# Patient Record
Sex: Female | Born: 1955 | Race: White | Hispanic: No | Marital: Married | Smoking: Former smoker
Health system: Southern US, Community
[De-identification: ages and names within clinical notes are randomized; demographics above are authoritative.]

## PROBLEM LIST (undated history)

## (undated) DIAGNOSIS — E785 Hyperlipidemia, unspecified: Secondary | ICD-10-CM

## (undated) DIAGNOSIS — F329 Major depressive disorder, single episode, unspecified: Secondary | ICD-10-CM

## (undated) DIAGNOSIS — F32A Depression, unspecified: Secondary | ICD-10-CM

## (undated) DIAGNOSIS — E079 Disorder of thyroid, unspecified: Secondary | ICD-10-CM

## (undated) HISTORY — DX: Depression, unspecified: F32.A

## (undated) HISTORY — DX: Disorder of thyroid, unspecified: E07.9

## (undated) HISTORY — DX: Major depressive disorder, single episode, unspecified: F32.9

## (undated) HISTORY — DX: Hyperlipidemia, unspecified: E78.5

---

## 2001-03-25 ENCOUNTER — Other Ambulatory Visit: Admission: RE | Admit: 2001-03-25 | Discharge: 2001-03-25 | Payer: Self-pay | Admitting: Internal Medicine

## 2006-08-18 ENCOUNTER — Ambulatory Visit: Payer: Self-pay | Admitting: Family Medicine

## 2006-12-09 ENCOUNTER — Other Ambulatory Visit: Payer: Self-pay

## 2006-12-09 ENCOUNTER — Emergency Department: Payer: Self-pay | Admitting: Internal Medicine

## 2006-12-09 ENCOUNTER — Ambulatory Visit: Payer: Self-pay | Admitting: Unknown Physician Specialty

## 2009-05-17 DIAGNOSIS — F32A Depression, unspecified: Secondary | ICD-10-CM | POA: Insufficient documentation

## 2009-08-11 DIAGNOSIS — R6889 Other general symptoms and signs: Secondary | ICD-10-CM | POA: Insufficient documentation

## 2011-10-14 ENCOUNTER — Ambulatory Visit: Payer: Self-pay | Admitting: Family Medicine

## 2011-10-14 LAB — HM MAMMOGRAPHY

## 2011-10-30 LAB — HM COLONOSCOPY

## 2011-11-01 LAB — HM DEXA SCAN: HM Dexa Scan: NORMAL

## 2013-09-08 LAB — HM PAP SMEAR: HM PAP: NEGATIVE

## 2014-09-16 LAB — LIPID PANEL
CHOLESTEROL: 177 mg/dL (ref 0–200)
HDL: 49 mg/dL (ref 35–70)
LDL Cholesterol: 106 mg/dL
Triglycerides: 112 mg/dL (ref 40–160)

## 2015-10-05 DIAGNOSIS — Z720 Tobacco use: Secondary | ICD-10-CM | POA: Insufficient documentation

## 2015-10-05 DIAGNOSIS — G2581 Restless legs syndrome: Secondary | ICD-10-CM | POA: Insufficient documentation

## 2015-10-05 DIAGNOSIS — M129 Arthropathy, unspecified: Secondary | ICD-10-CM | POA: Insufficient documentation

## 2015-10-05 DIAGNOSIS — R319 Hematuria, unspecified: Secondary | ICD-10-CM | POA: Insufficient documentation

## 2015-10-05 DIAGNOSIS — G4700 Insomnia, unspecified: Secondary | ICD-10-CM | POA: Insufficient documentation

## 2015-10-06 ENCOUNTER — Ambulatory Visit (INDEPENDENT_AMBULATORY_CARE_PROVIDER_SITE_OTHER): Payer: BC Managed Care – PPO | Admitting: Family Medicine

## 2015-10-06 ENCOUNTER — Encounter: Payer: Self-pay | Admitting: Family Medicine

## 2015-10-06 VITALS — BP 108/68 | HR 96 | Temp 98.7°F | Resp 16 | Ht 62.0 in | Wt 136.0 lb

## 2015-10-06 DIAGNOSIS — Z1239 Encounter for other screening for malignant neoplasm of breast: Secondary | ICD-10-CM | POA: Diagnosis not present

## 2015-10-06 DIAGNOSIS — E038 Other specified hypothyroidism: Secondary | ICD-10-CM

## 2015-10-06 DIAGNOSIS — R319 Hematuria, unspecified: Secondary | ICD-10-CM

## 2015-10-06 DIAGNOSIS — F329 Major depressive disorder, single episode, unspecified: Secondary | ICD-10-CM

## 2015-10-06 DIAGNOSIS — E785 Hyperlipidemia, unspecified: Secondary | ICD-10-CM

## 2015-10-06 DIAGNOSIS — Z23 Encounter for immunization: Secondary | ICD-10-CM | POA: Diagnosis not present

## 2015-10-06 DIAGNOSIS — G4700 Insomnia, unspecified: Secondary | ICD-10-CM

## 2015-10-06 DIAGNOSIS — Z0000 Encounter for general adult medical examination without abnormal findings: Secondary | ICD-10-CM | POA: Diagnosis not present

## 2015-10-06 DIAGNOSIS — F32A Depression, unspecified: Secondary | ICD-10-CM

## 2015-10-06 LAB — POCT URINALYSIS DIPSTICK
BILIRUBIN UA: NEGATIVE
GLUCOSE UA: NEGATIVE
KETONES UA: NEGATIVE
LEUKOCYTES UA: NEGATIVE
Nitrite, UA: NEGATIVE
Protein, UA: NEGATIVE
Spec Grav, UA: 1.01
Urobilinogen, UA: 0.2
pH, UA: 7

## 2015-10-06 MED ORDER — FLUOXETINE HCL 20 MG PO CAPS: 20.0000 mg | ORAL_CAPSULE | Freq: Every day | ORAL | Status: DC

## 2015-10-06 MED ORDER — ATORVASTATIN CALCIUM 10 MG PO TABS: 10.0000 mg | ORAL_TABLET | Freq: Every day | ORAL | Status: DC

## 2015-10-06 MED ORDER — ZOLPIDEM TARTRATE 10 MG PO TABS: 10.0000 mg | ORAL_TABLET | Freq: Every evening | ORAL | Status: DC | PRN

## 2015-10-06 MED ORDER — LEVOTHYROXINE SODIUM 50 MCG PO TABS: 50.0000 ug | ORAL_TABLET | Freq: Every day | ORAL | Status: DC

## 2015-10-06 NOTE — Progress Notes (Signed)
Patient ID: Brittany Reese, female   DOB: 1956/11/21, 58 y.o.   MRN: [id number]        Patient: Brittany Reese, Female    DOB: 17-Mar-1956, 59 y.o.   MRN: [id number] Visit Date: 10/06/2015  Today's Provider: Lorie Phenix, MD   Chief Complaint  Patient presents with  . Annual Exam  . Depression  . Insomnia   Subjective:    Annual physical exam Brittany Reese is a 59 y.o. female who presents today for health maintenance and complete physical. She feels well. She reports exercising daily. She reports she is sleeping fairly well, on medication. See below.    09/08/13 Pap-neg; HPV-neg 10/14/11 Mammo-BI-RADS 2 10/30/11 Colon-diverticulosis, internal hemorrhoids 11/11/11 BMD-normal  Lab Results  Component Value Date   CHOL 177 09/16/2014   TRIG 112 09/16/2014   HDL 49 09/16/2014   LDLCALC 106 09/16/2014   Results for orders placed or performed in visit on 10/06/15  POCT urinalysis dipstick  Result Value Ref Range   Color, UA straw    Clarity, UA clear    Glucose, UA neg    Bilirubin, UA neg    Ketones, UA neg    Spec Grav, UA 1.010    Blood, UA trace    pH, UA 7.0    Protein, UA neg    Urobilinogen, UA 0.2    Nitrite, UA neg    Leukocytes, UA Negative Negative    -----------------------------------------------------------------  Insomnia  She presents today for evaluation of insomnia. Onset was several  years ago. Insomnia is getting better. She has trouble sleeping several times a month.   She has difficulty FALLING asleep. She does not have difficulty STAYING asleep. She does wake frequently to urinate. She does have urge to move legs when resting. She is not having pain when trying to sleep  She is not having anxiety. She is not having a lot of stress in her life. . She is not having depression.  She is taking stimulant medications.  She is not taking new medications:  She is not taking OTC sleeping aid. She is taking medications to help  sleep. She is not drinking alcohol to help sleep. She is not using illicit drugs.  --------------------------------------------------------------------   Depression, Follow-up  She  was last seen for this 1 years ago. Changes made at last visit include none.   She reports excellent compliance with treatment. She is not having side effects.   She reports excellent tolerance of treatment. Current symptoms include: difficulty concentrating She feels she is Improved since last visit. Would like to continue with current medication.    ------------------------------------------------------------------------  Review of Systems  Constitutional: Negative.   HENT: Negative.   Eyes: Negative.   Respiratory: Negative.   Cardiovascular: Negative.   Gastrointestinal: Negative.   Endocrine: Negative.   Genitourinary: Negative.   Musculoskeletal: Negative.   Skin: Negative.   Allergic/Immunologic: Negative.   Neurological: Negative.   Hematological: Negative.   Psychiatric/Behavioral: Positive for sleep disturbance.    Social History She  reports that she quit smoking about a year ago. Her smoking use included Cigarettes. She has never used smokeless tobacco. She reports that she drinks about 1.8 oz of alcohol per week. She reports that she does not use illicit drugs. Social History   Social History  . Marital Status: Married    Spouse Name: N/A  . Number of Children: N/A  . Years of Education: N/A   Social History Main Topics  . Smoking  status: Former Smoker    Types: Cigarettes    Quit date: 10/18/2014  . Smokeless tobacco: Never Used  . Alcohol Use: 1.8 oz/week    3 Cans of beer per week     Comment: occasional  . Drug Use: No  . Sexual Activity: Not Asked   Other Topics Concern  . None   Social History Narrative    Patient Active Problem List   Diagnosis Date Noted  . Arthropathia 10/05/2015  . Current tobacco use 10/05/2015  . Blood in the urine 10/05/2015    . Cannot sleep 10/05/2015  . Restless leg 10/05/2015  . Other general symptoms and signs 08/11/2009  . Avitaminosis D 08/11/2009  . Addison's keloid 08/07/2009  . Clinical depression 05/17/2009  . HLD (hyperlipidemia) 05/17/2009  . Adult hypothyroidism 05/17/2009  . Osteopenia 05/17/2009  . History of colon polyps 12/12/2006  . History of tobacco use 11/25/1988    History reviewed. No pertinent past surgical history.  Family History  Family Status  Relation Status Death Age  . Mother Deceased   . Father Deceased   . Brother Alive   . Brother Alive    Her family history includes COPD in her mother; Healthy in her brother and brother; Heart disease in her mother; Hypertension in her mother; Kidney disease in her father.    No Known Allergies  Previous Medications   ALCLOMETHASONE (ACLOVATE) 0.05 % CREAM    ALCLOMETASONE DIPROPIONATE, 0.05% (External Cream)  1 (one) Cream apply to affected area bid prn for 0 days  Quantity: 30;  Refills: 11   Ordered :21-Sep-2014  Lorie PhenixMaloney, Halei Hanover MD;  Started 21-Sep-2014 Active Comments: DX: 701.0   CALCIUM CARBONATE-VITAMIN D 600-200 MG-UNIT CAPS    Take 1 tablet by mouth daily.   FLUOCINONIDE CREAM (LIDEX) 0.05 %    FLUOCINONIDE, 0.05% (External Cream)  1 (one) Cream daily as needed poison ivy for 0 days  Quantity: 60;  Refills: 0   Ordered :21-Sep-2014  Lorie PhenixMaloney, Tisa Weisel MD;  Started 21-Sep-2014 Active Comments: Medication taken as needed.     Patient Care Team: Lorie PhenixNancy Bhavana Kady, MD as PCP - General (Family Medicine)     Objective:   Vitals: BP 108/68 mmHg  Pulse 96  Temp(Src) 98.7 F (37.1 C) (Oral)  Resp 16  Ht 5\' 2"  (1.575 m)  Wt 136 lb (61.689 kg)  BMI 24.87 kg/m2   Physical Exam  Constitutional: She is oriented to person, place, and time. She appears well-developed and well-nourished.  HENT:  Head: Normocephalic and atraumatic.  Right Ear: Tympanic membrane, external ear and ear canal normal.  Left Ear: Tympanic  membrane, external ear and ear canal normal.  Nose: Nose normal.  Mouth/Throat: Uvula is midline, oropharynx is clear and moist and mucous membranes are normal.  Eyes: Conjunctivae, EOM and lids are normal. Pupils are equal, round, and reactive to light.  Neck: Trachea normal and normal range of motion. Neck supple. Carotid bruit is not present. No thyroid mass and no thyromegaly present.  Cardiovascular: Normal rate, regular rhythm and normal heart sounds.   Pulmonary/Chest: Effort normal and breath sounds normal.  Abdominal: Soft. Normal appearance and bowel sounds are normal. There is no hepatosplenomegaly. There is no tenderness.  Genitourinary: No breast swelling, tenderness or discharge.  Musculoskeletal: Normal range of motion.  Lymphadenopathy:    She has no cervical adenopathy.    She has no axillary adenopathy.  Neurological: She is alert and oriented to person, place, and time. She has normal strength.  No cranial nerve deficit.  Skin: Skin is warm, dry and intact.  Psychiatric: She has a normal mood and affect. Her speech is normal and behavior is normal. Judgment and thought content normal. Cognition and memory are normal.     Depression Screen PHQ 2/9 Scores 10/06/2015  PHQ - 2 Score 0      Assessment & Plan:     Routine Health Maintenance and Physical Exam  Exercise Activities and Dietary recommendations Goals    . Exercise 150 minutes per week (moderate activity)       Immunization History  Administered Date(s) Administered  . Influenza,inj,Quad PF,36+ Mos 10/06/2015  . Tdap 04/29/2005     1. Annual physical exam Patient seen and examined by Dr. Leo Grosser, and note scribed by Liz Beach. Dimas, CMA.  2. Breast cancer screening - MM DIGITAL SCREENING BILATERAL; Future  3. Need for influenza vaccination - Flu Vaccine QUAD 36+ mos IM  4. Other specified hypothyroidism Stable. Patient advised to continue levothyroxine 50 mcg as below. -  levothyroxine (SYNTHROID) 50 MCG tablet; Take 1 tablet (50 mcg total) by mouth daily before breakfast. Daily; 1 1/2 on Mon., Wed., and Friday  Dispense: 30 tablet; Refill: 5 - TSH  5. Cannot sleep Stable. Patient advised to continue zolpidem 10 mg as needed for sleep. - zolpidem (AMBIEN) 10 MG tablet; Take 1 tablet (10 mg total) by mouth at bedtime as needed for sleep. 1/2 -1 tablet every night  Dispense: 30 tablet; Refill: 3  6. Clinical depression Stable. Patient advised to continue fluoxetine 20 mg as below. - FLUoxetine (PROZAC) 20 MG capsule; Take 1 capsule (20 mg total) by mouth daily.  Dispense: 30 capsule; Refill: 5  7. HLD (hyperlipidemia) Stable. Patient advised to continue atorvastatin 10 mg daily as below.  - atorvastatin (LIPITOR) 10 MG tablet; Take 1 tablet (10 mg total) by mouth at bedtime.  Dispense: 30 tablet; Refill: 5 - CBC with Differential/Platelet - Comprehensive metabolic panel - Lipid Panel With LDL/HDL Ratio  8. Blood in the urine F/U pending lab report. - Urine Microscopic   Patient seen and examined by Dr. Leo Grosser, and note scribed by Liz Beach. Dimas, CMA.  I have reviewed the document for accuracy and completeness and I agree with above. Leo Grosser, MD   Lorie Phenix, MD    --------------------------------------------------------------------

## 2015-10-06 NOTE — Patient Instructions (Signed)
Please call the [facility] at [facility] to schedule this at [phone]   

## 2015-10-07 LAB — CBC WITH DIFFERENTIAL/PLATELET
Basophils Absolute: 0 10*3/uL (ref 0.0–0.2)
Basos: 0 %
EOS (ABSOLUTE): 0.1 10*3/uL (ref 0.0–0.4)
Eos: 2 %
HEMATOCRIT: 40.2 % (ref 34.0–46.6)
Hemoglobin: 13.1 g/dL (ref 11.1–15.9)
IMMATURE GRANULOCYTES: 0 %
Immature Grans (Abs): 0 10*3/uL (ref 0.0–0.1)
LYMPHS ABS: 2 10*3/uL (ref 0.7–3.1)
Lymphs: 34 %
MCH: 27.6 pg (ref 26.6–33.0)
MCHC: 32.6 g/dL (ref 31.5–35.7)
MCV: 85 fL (ref 79–97)
MONOS ABS: 0.6 10*3/uL (ref 0.1–0.9)
Monocytes: 9 %
NEUTROS PCT: 55 %
Neutrophils Absolute: 3.2 10*3/uL (ref 1.4–7.0)
PLATELETS: 238 10*3/uL (ref 150–379)
RBC: 4.75 x10E6/uL (ref 3.77–5.28)
RDW: 13.6 % (ref 12.3–15.4)
WBC: 6 10*3/uL (ref 3.4–10.8)

## 2015-10-07 LAB — COMPREHENSIVE METABOLIC PANEL
A/G RATIO: 2 (ref 1.1–2.5)
ALK PHOS: 96 IU/L (ref 39–117)
ALT: 12 IU/L (ref 0–32)
AST: 20 IU/L (ref 0–40)
Albumin: 4.6 g/dL (ref 3.5–5.5)
BUN/Creatinine Ratio: 12 (ref 9–23)
BUN: 13 mg/dL (ref 6–24)
Bilirubin Total: 0.4 mg/dL (ref 0.0–1.2)
CALCIUM: 10.1 mg/dL (ref 8.7–10.2)
CO2: 25 mmol/L (ref 18–29)
Chloride: 97 mmol/L (ref 97–106)
Creatinine, Ser: 1.05 mg/dL — ABNORMAL HIGH (ref 0.57–1.00)
GFR calc Af Amer: 67 mL/min/{1.73_m2} (ref >59)
GFR, EST NON AFRICAN AMERICAN: 58 mL/min/{1.73_m2} — AB (ref >59)
GLOBULIN, TOTAL: 2.3 g/dL (ref 1.5–4.5)
Glucose: 83 mg/dL (ref 65–99)
POTASSIUM: 4.3 mmol/L (ref 3.5–5.2)
SODIUM: 139 mmol/L (ref 136–144)
Total Protein: 6.9 g/dL (ref 6.0–8.5)

## 2015-10-07 LAB — LIPID PANEL WITH LDL/HDL RATIO
CHOLESTEROL TOTAL: 184 mg/dL (ref 100–199)
HDL: 51 mg/dL (ref >39)
LDL Calculated: 108 mg/dL — ABNORMAL HIGH (ref 0–99)
LDL/HDL RATIO: 2.1 ratio (ref 0.0–3.2)
TRIGLYCERIDES: 124 mg/dL (ref 0–149)
VLDL Cholesterol Cal: 25 mg/dL (ref 5–40)

## 2015-10-07 LAB — TSH: TSH: 10.28 u[IU]/mL — ABNORMAL HIGH (ref 0.450–4.500)

## 2015-10-09 ENCOUNTER — Telehealth: Payer: Self-pay

## 2015-10-09 DIAGNOSIS — E038 Other specified hypothyroidism: Secondary | ICD-10-CM

## 2015-10-09 NOTE — Telephone Encounter (Signed)
LMTCB Leta Bucklin Drozdowski, CMA  

## 2015-10-09 NOTE — Telephone Encounter (Signed)
-----   Message from Lorie PhenixNancy Maloney, MD sent at 10/08/2015 11:19 AM EST ----- Labs stable except for low thyroid.  Recommend increase Levothyroxine to 75 mcg (clarify pharmacy) and recheck labs in 6 weeks. Thanks-Dr. Judie PetitM.

## 2015-10-12 NOTE — Telephone Encounter (Signed)
LMTCB 10/12/2015  Thanks,   -Josette Shimabukuro  

## 2015-10-13 MED ORDER — LEVOTHYROXINE SODIUM 75 MCG PO TABS: 75.0000 ug | ORAL_TABLET | Freq: Every day | ORAL | Status: DC

## 2015-10-13 NOTE — Telephone Encounter (Signed)
Pt advised. [facility]   Thanks,   -Vernona RiegerLaura

## 2015-11-03 ENCOUNTER — Encounter: Payer: Self-pay | Admitting: Family Medicine

## 2015-12-04 ENCOUNTER — Ambulatory Visit: Payer: BC Managed Care – PPO | Admitting: Family Medicine

## 2015-12-12 ENCOUNTER — Ambulatory Visit (INDEPENDENT_AMBULATORY_CARE_PROVIDER_SITE_OTHER): Payer: BC Managed Care – PPO | Admitting: Family Medicine

## 2015-12-12 DIAGNOSIS — E038 Other specified hypothyroidism: Secondary | ICD-10-CM

## 2015-12-12 NOTE — Progress Notes (Signed)
Lab order printed. No OV needed.

## 2015-12-13 ENCOUNTER — Telehealth: Payer: Self-pay

## 2015-12-13 LAB — TSH: TSH: 1.24 u[IU]/mL (ref 0.450–4.500)

## 2015-12-13 NOTE — Telephone Encounter (Signed)
Pt advised.   Thanks,   -Laura  

## 2015-12-13 NOTE — Telephone Encounter (Signed)
-----   Message from Lorie Phenix, MD sent at 12/13/2015 11:11 AM EST ----- Thyroid normal. Please notify patient. Thanks.

## 2015-12-13 NOTE — Telephone Encounter (Signed)
LMTCB 12/13/2015  Thanks,   -Marwan Lipe 

## 2016-03-19 ENCOUNTER — Ambulatory Visit
Admission: RE | Admit: 2016-03-19 | Discharge: 2016-03-19 | Disposition: A | Discharge status: Home / Self Care | Payer: BC Managed Care – PPO | Source: Ambulatory Visit | Attending: Family Medicine | Admitting: Family Medicine

## 2016-03-19 DIAGNOSIS — Z1231 Encounter for screening mammogram for malignant neoplasm of breast: Secondary | ICD-10-CM | POA: Insufficient documentation

## 2016-03-19 DIAGNOSIS — Z1239 Encounter for other screening for malignant neoplasm of breast: Secondary | ICD-10-CM

## 2016-03-19 DIAGNOSIS — N6489 Other specified disorders of breast: Secondary | ICD-10-CM | POA: Insufficient documentation

## 2016-03-20 ENCOUNTER — Other Ambulatory Visit: Payer: Self-pay | Admitting: Family Medicine

## 2016-03-20 DIAGNOSIS — R928 Other abnormal and inconclusive findings on diagnostic imaging of breast: Secondary | ICD-10-CM

## 2016-03-26 ENCOUNTER — Ambulatory Visit
Admission: RE | Admit: 2016-03-26 | Discharge: 2016-03-26 | Disposition: A | Discharge status: Home / Self Care | Payer: BC Managed Care – PPO | Source: Ambulatory Visit | Attending: Family Medicine | Admitting: Family Medicine

## 2016-03-26 DIAGNOSIS — R928 Other abnormal and inconclusive findings on diagnostic imaging of breast: Secondary | ICD-10-CM

## 2016-04-01 ENCOUNTER — Encounter: Payer: Self-pay | Admitting: Family Medicine

## 2016-04-01 ENCOUNTER — Telehealth: Payer: Self-pay | Admitting: Family Medicine

## 2016-04-01 DIAGNOSIS — G4700 Insomnia, unspecified: Secondary | ICD-10-CM

## 2016-04-01 MED ORDER — ZOLPIDEM TARTRATE 10 MG PO TABS: 10.0000 mg | ORAL_TABLET | Freq: Every evening | ORAL | Status: DC | PRN

## 2016-04-01 NOTE — Telephone Encounter (Signed)
Printed, please fax or call in to pharmacy. Thank you.   

## 2016-04-02 ENCOUNTER — Encounter: Payer: Self-pay | Admitting: Family Medicine

## 2016-04-02 DIAGNOSIS — F32A Depression, unspecified: Secondary | ICD-10-CM

## 2016-04-02 DIAGNOSIS — F329 Major depressive disorder, single episode, unspecified: Secondary | ICD-10-CM

## 2016-04-02 DIAGNOSIS — E785 Hyperlipidemia, unspecified: Secondary | ICD-10-CM

## 2016-04-02 NOTE — Telephone Encounter (Signed)
Called Rx into pharmacy.  

## 2016-04-03 MED ORDER — ATORVASTATIN CALCIUM 10 MG PO TABS
10.0000 mg | ORAL_TABLET | Freq: Every day | ORAL | Status: DC
Start: 2016-04-03 — End: 2016-10-15

## 2016-04-03 MED ORDER — FLUOXETINE HCL 20 MG PO CAPS: 20.0000 mg | ORAL_CAPSULE | Freq: Every day | ORAL | Status: DC

## 2016-05-02 ENCOUNTER — Encounter: Payer: Self-pay | Admitting: Family Medicine

## 2016-05-02 DIAGNOSIS — E038 Other specified hypothyroidism: Secondary | ICD-10-CM

## 2016-05-02 MED ORDER — LEVOTHYROXINE SODIUM 75 MCG PO TABS: 75.0000 ug | ORAL_TABLET | Freq: Every day | ORAL | Status: DC

## 2016-07-17 ENCOUNTER — Encounter: Payer: Self-pay | Admitting: Family Medicine

## 2016-07-31 ENCOUNTER — Ambulatory Visit (INDEPENDENT_AMBULATORY_CARE_PROVIDER_SITE_OTHER): Payer: BC Managed Care – PPO | Admitting: Family Medicine

## 2016-07-31 ENCOUNTER — Encounter: Payer: Self-pay | Admitting: Family Medicine

## 2016-07-31 VITALS — BP 104/48 | HR 70 | Temp 98.3°F | Resp 16 | Wt 133.8 lb

## 2016-07-31 DIAGNOSIS — R1032 Left lower quadrant pain: Secondary | ICD-10-CM | POA: Diagnosis not present

## 2016-07-31 MED ORDER — CIPROFLOXACIN HCL 500 MG PO TABS: 500.0000 mg | ORAL_TABLET | Freq: Two times a day (BID) | ORAL | 0 refills | Status: DC

## 2016-07-31 MED ORDER — METRONIDAZOLE 500 MG PO TABS
500.0000 mg | ORAL_TABLET | Freq: Two times a day (BID) | ORAL | 0 refills | Status: DC
Start: 2016-07-31 — End: 2016-10-15

## 2016-07-31 NOTE — Patient Instructions (Addendum)
We will call about the ultrasound report. Do check with Dr.Elliott about scheduling a colonoscopy this year.

## 2016-07-31 NOTE — Progress Notes (Signed)
Subjective:     Patient ID: Brittany Reese, female   DOB: [birth year], 60 y.o.   MRN: [id number]  HPI  Chief Complaint  Patient presents with  . Abdominal Pain    Patient comes into office with conerns of lower abdominal pain for the past two weeks. Patient describes pain as cramping and radiating to her back. Associated patient complains of bloating, patient has been taking otc gas relief and ibuprofen.   States recurrent pain will last for 45 seconds then abate. No dysuria or change in bowel pattern. Hx of diverticulosis (colonoscopy 2012) and is due f/u colonoscopy this year. Weight is stable.   Review of Systems     Objective:   Physical Exam  Constitutional: She appears well-developed and well-nourished. No distress (anxious).  Abdominal: Bowel sounds are normal. There is tenderness ( left lower quadrant). There is guarding.       Assessment:    1. Left lower quadrant pain: will cover for diverticulitis pending ultrasound - US Pelvis Complete; Future - ciprofloxacin (CIPRO) 500 MG tablet; Take 1 tablet (500 mg total) by mouth 2 (two) times daily.  Dispense: 14 tablet; Refill: 0 - metroNIDAZOLE (FLAGYL) 500 MG tablet; Take 1 tablet (500 mg total) by mouth 2 (two) times daily.  Dispense: 14 tablet; Refill: 0    Plan:    Further f/u pending ultrasound results. Discussed giving bowel a rest with clear liquids/soups. She will check with G.I.about scheduling her colonoscopy.

## 2016-08-02 ENCOUNTER — Ambulatory Visit
Admission: RE | Admit: 2016-08-02 | Discharge: 2016-08-02 | Disposition: A | Discharge status: Home / Self Care | Payer: BC Managed Care – PPO | Source: Ambulatory Visit | Attending: Family Medicine | Admitting: Family Medicine

## 2016-08-02 DIAGNOSIS — N854 Malposition of uterus: Secondary | ICD-10-CM | POA: Insufficient documentation

## 2016-08-02 DIAGNOSIS — R1032 Left lower quadrant pain: Secondary | ICD-10-CM | POA: Diagnosis not present

## 2016-10-07 ENCOUNTER — Encounter: Payer: BC Managed Care – PPO | Admitting: Physician Assistant

## 2016-10-15 ENCOUNTER — Encounter: Payer: Self-pay | Admitting: Physician Assistant

## 2016-10-15 ENCOUNTER — Ambulatory Visit (INDEPENDENT_AMBULATORY_CARE_PROVIDER_SITE_OTHER): Payer: BC Managed Care – PPO | Admitting: Physician Assistant

## 2016-10-15 VITALS — BP 140/80 | HR 71 | Temp 98.3°F | Resp 16 | Ht 63.0 in | Wt 137.0 lb

## 2016-10-15 DIAGNOSIS — F3342 Major depressive disorder, recurrent, in full remission: Secondary | ICD-10-CM | POA: Diagnosis not present

## 2016-10-15 DIAGNOSIS — Z833 Family history of diabetes mellitus: Secondary | ICD-10-CM

## 2016-10-15 DIAGNOSIS — Z124 Encounter for screening for malignant neoplasm of cervix: Secondary | ICD-10-CM

## 2016-10-15 DIAGNOSIS — Z114 Encounter for screening for human immunodeficiency virus [HIV]: Secondary | ICD-10-CM | POA: Diagnosis not present

## 2016-10-15 DIAGNOSIS — E039 Hypothyroidism, unspecified: Secondary | ICD-10-CM | POA: Diagnosis not present

## 2016-10-15 DIAGNOSIS — E785 Hyperlipidemia, unspecified: Secondary | ICD-10-CM

## 2016-10-15 DIAGNOSIS — Z0000 Encounter for general adult medical examination without abnormal findings: Secondary | ICD-10-CM

## 2016-10-15 DIAGNOSIS — F5101 Primary insomnia: Secondary | ICD-10-CM | POA: Diagnosis not present

## 2016-10-15 MED ORDER — LEVOTHYROXINE SODIUM 75 MCG PO TABS: 75.0000 ug | ORAL_TABLET | Freq: Every day | ORAL | 3 refills | Status: DC

## 2016-10-15 MED ORDER — ATORVASTATIN CALCIUM 10 MG PO TABS: 10.0000 mg | ORAL_TABLET | Freq: Every day | ORAL | 3 refills | Status: DC

## 2016-10-15 MED ORDER — FLUOXETINE HCL 20 MG PO CAPS: 20.0000 mg | ORAL_CAPSULE | Freq: Every day | ORAL | 3 refills | Status: DC

## 2016-10-15 MED ORDER — ZOLPIDEM TARTRATE 10 MG PO TABS: 10.0000 mg | ORAL_TABLET | Freq: Every evening | ORAL | 1 refills | Status: DC | PRN

## 2016-10-15 NOTE — Patient Instructions (Signed)

## 2016-10-15 NOTE — Progress Notes (Signed)
Patient: Brittany Reese, Female    DOB: 1956-11-01, 60 y.o.   MRN: [id number] Visit Date: 10/15/2016  Today's Provider: Margaretann LovelessJennifer M Hind Chesler, PA-C   Chief Complaint  Patient presents with  . Annual Exam   Subjective:    Annual physical exam Brittany Reese is a 60 y.o. female who presents today for health maintenance and complete physical. She feels well. She reports exercising. She reports she is sleeping fairly well.  10-06-15 CPE 09-08-13 Pap-neg HPV-neg 03/26/16 Mammogram-BI-RADS 1. 11/29/10 Colonoscopy-Diverticulosis, internal hemorrhoids. Recheck 5 years;Patient already has appointment scheduled. Patient is due for Td booster, influenza and zoster. She would like to await getting these vaccinations and will call to see if shingles vaccine is approved.  -----------------------------------------------------------------   Review of Systems  Constitutional: Negative.   HENT: Negative.   Eyes: Negative.   Respiratory: Negative.   Cardiovascular: Negative.   Gastrointestinal: Positive for abdominal distention and abdominal pain.  Endocrine: Negative.   Genitourinary: Negative.   Musculoskeletal: Negative.   Skin: Negative.   Allergic/Immunologic: Negative.   Neurological: Negative.   Hematological: Negative.   Psychiatric/Behavioral: Negative.     Social History      She  reports that she quit smoking about 1 years ago. Her smoking use included Cigarettes. She has never used smokeless tobacco. She reports that she drinks about 1.8 oz of alcohol per week . She reports that she does not use drugs.       Social History   Social History  . Marital status: Married    Spouse name: N/A  . Number of children: N/A  . Years of education: N/A   Social History Main Topics  . Smoking status: Former Smoker    Types: Cigarettes    Quit date: 10/18/2014  . Smokeless tobacco: Never Used  . Alcohol use 1.8 oz/week    3 Cans of beer per week     Comment: occasional    . Drug use: No  . Sexual activity: Not Asked   Other Topics Concern  . None   Social History Narrative  . None    Past Medical History:  Diagnosis Date  . Depression   . Hyperlipidemia   . Thyroid disease      Patient Active Problem List   Diagnosis Date Noted  . Arthropathia 10/05/2015  . Current tobacco use 10/05/2015  . Blood in the urine 10/05/2015  . Cannot sleep 10/05/2015  . Restless leg 10/05/2015  . Other general symptoms and signs 08/11/2009  . Avitaminosis D 08/11/2009  . Addison's keloid 08/07/2009  . Clinical depression 05/17/2009  . HLD (hyperlipidemia) 05/17/2009  . Adult hypothyroidism 05/17/2009  . Osteopenia 05/17/2009  . History of colon polyps 12/12/2006  . History of tobacco use 11/25/1988    History reviewed. No pertinent surgical history.  Family History        Family Status  Relation Status  . Mother Deceased  . Father Deceased  . Brother Alive  . Brother Alive        Her family history includes COPD in her mother; Healthy in her brother and brother; Heart disease in her mother; Hypertension in her mother; Kidney disease in her father.     No Known Allergies   Current Outpatient Prescriptions:  .  alclomethasone (ACLOVATE) 0.05 % cream, ALCLOMETASONE DIPROPIONATE, 0.05% (External Cream)  1 (one) Cream apply to affected area bid prn for 0 days  Quantity: 30;  Refills: 11   Ordered :21-Sep-2014  Lorie Phenix MD;  Started 21-Sep-2014 Active Comments: DX: 701.0, Disp: , Rfl:  .  atorvastatin (LIPITOR) 10 MG tablet, Take 1 tablet (10 mg total) by mouth at bedtime., Disp: 90 tablet, Rfl: 3 .  Calcium Carbonate-Vitamin D 600-200 MG-UNIT CAPS, Take 1 tablet by mouth daily., Disp: , Rfl:  .  FLUoxetine (PROZAC) 20 MG capsule, Take 1 capsule (20 mg total) by mouth daily., Disp: 90 capsule, Rfl: 3 .  levothyroxine (SYNTHROID, LEVOTHROID) 75 MCG tablet, Take 1 tablet (75 mcg total) by mouth daily before breakfast., Disp: 90 tablet, Rfl: 1 .   zolpidem (AMBIEN) 10 MG tablet, Take 1 tablet (10 mg total) by mouth at bedtime as needed for sleep. 1/2 -1 tablet every night, Disp: 90 tablet, Rfl: 1 .  ciprofloxacin (CIPRO) 500 MG tablet, Take 1 tablet (500 mg total) by mouth 2 (two) times daily. (Patient not taking: Reported on 10/15/2016), Disp: 14 tablet, Rfl: 0 .  fluocinonide cream (LIDEX) 0.05 %, FLUOCINONIDE, 0.05% (External Cream)  1 (one) Cream daily as needed poison ivy for 0 days  Quantity: 60;  Refills: 0   Ordered :21-Sep-2014  Lorie Phenix MD;  Started 21-Sep-2014 Active Comments: Medication taken as needed. , Disp: , Rfl:  .  metroNIDAZOLE (FLAGYL) 500 MG tablet, Take 1 tablet (500 mg total) by mouth 2 (two) times daily. (Patient not taking: Reported on 10/15/2016), Disp: 14 tablet, Rfl: 0   Patient Care Team: Margaretann Loveless, PA-C as PCP - General (Family Medicine)      Objective:   Vitals: BP 140/80 (BP Location: Right Arm, Patient Position: Sitting, Cuff Size: Normal)   Pulse 71   Temp 98.3 F (36.8 C) (Oral)   Resp 16   Ht 5\' 3"  (1.6 m)   Wt 137 lb (62.1 kg)   BMI 24.27 kg/m    Physical Exam  Constitutional: She is oriented to person, place, and time. She appears well-developed and well-nourished. No distress.  HENT:  Head: Normocephalic and atraumatic.  Right Ear: Hearing, tympanic membrane, external ear and ear canal normal.  Left Ear: Hearing, tympanic membrane, external ear and ear canal normal.  Nose: Nose normal.  Mouth/Throat: Uvula is midline, oropharynx is clear and moist and mucous membranes are normal. No oropharyngeal exudate.  Eyes: Conjunctivae and EOM are normal. Pupils are equal, round, and reactive to light. Right eye exhibits no discharge. Left eye exhibits no discharge. No scleral icterus.  Neck: Normal range of motion. Neck supple. No JVD present. Carotid bruit is not present. No tracheal deviation present. No thyromegaly present.  Cardiovascular: Normal rate, regular rhythm, normal  heart sounds and intact distal pulses.  Exam reveals no gallop and no friction rub.   No murmur heard. Pulmonary/Chest: Effort normal and breath sounds normal. No respiratory distress. She has no wheezes. She has no rales. She exhibits no tenderness. Right breast exhibits no inverted nipple, no mass, no nipple discharge, no skin change and no tenderness. Left breast exhibits no inverted nipple, no mass, no nipple discharge, no skin change and no tenderness. Breasts are symmetrical.  Abdominal: Soft. Bowel sounds are normal. She exhibits no distension and no mass. There is no tenderness. There is no rebound and no guarding. Hernia confirmed negative in the right inguinal area and confirmed negative in the left inguinal area.  Genitourinary: Rectum normal, vagina normal and uterus normal. No breast swelling, tenderness, discharge or bleeding. Pelvic exam was performed with patient supine. There is no rash, tenderness, lesion or injury on the  right labia. There is no rash, tenderness, lesion or injury on the left labia. Cervix exhibits no motion tenderness, no discharge and no friability. Right adnexum displays no mass, no tenderness and no fullness. Left adnexum displays no mass, no tenderness and no fullness. No erythema, tenderness or bleeding in the vagina. No signs of injury around the vagina. No vaginal discharge found.  Musculoskeletal: Normal range of motion. She exhibits no edema or tenderness.  Lymphadenopathy:    She has no cervical adenopathy.       Right: No inguinal adenopathy present.       Left: No inguinal adenopathy present.  Neurological: She is alert and oriented to person, place, and time. She has normal reflexes. No cranial nerve deficit. Coordination normal.  Skin: Skin is warm and dry. No rash noted. She is not diaphoretic.  Psychiatric: She has a normal mood and affect. Her behavior is normal. Judgment and thought content normal.  Vitals reviewed.    Depression Screen PHQ 2/9  Scores 10/06/2015  PHQ - 2 Score 0      Assessment & Plan:     Routine Health Maintenance and Physical Exam  Exercise Activities and Dietary recommendations Goals    . Exercise 150 minutes per week (moderate activity)       Immunization History  Administered Date(s) Administered  . Influenza,inj,Quad PF,36+ Mos 10/06/2015  . Tdap 04/29/2005    Health Maintenance  Topic Date Due  . HIV Screening  10/09/1970  . TETANUS/TDAP  04/30/2015  . ZOSTAVAX  10/10/2015  . INFLUENZA VACCINE  06/25/2016  . PAP SMEAR  09/08/2016  . MAMMOGRAM  03/19/2018  . COLONOSCOPY  10/29/2021  . Hepatitis C Screening  Completed     Discussed health benefits of physical activity, and encouraged her to engage in regular exercise appropriate for her age and condition.   1. Annual physical exam Normal physical exam today. Will check labs as below and f/u pending lab results. If labs are stable and WNL she will not need to have these rechecked for one year at her next annual physical exam. She is to call the office in the meantime if she has any acute issue, questions or concerns. - CBC with Differential/Platelet - Comprehensive metabolic panel  2. Cervical cancer screening Pap collected today. Will send as below and f/u pending results. - Pap IG and HPV (high risk) DNA detection  3. Family history of diabetes mellitus (DM) Will check labs as below and f/u pending results. - Hemoglobin A1c  4. Adult hypothyroidism Stable. Diagnosis pulled for medication refill. Continue current medical treatment plan. Will check labs as below and f/u pending results. - TSH - levothyroxine (SYNTHROID, LEVOTHROID) 75 MCG tablet; Take 1 tablet (75 mcg total) by mouth daily before breakfast.  Dispense: 90 tablet; Refill: 3  5. Hyperlipidemia, unspecified hyperlipidemia type Will check labs as below and f/u pending results. - Comprehensive metabolic panel - Lipid panel - atorvastatin (LIPITOR) 10 MG tablet;  Take 1 tablet (10 mg total) by mouth at bedtime.  Dispense: 90 tablet; Refill: 3  6. Recurrent major depressive disorder, in full remission (HCC) Stable. Diagnosis pulled for medication refill. Continue current medical treatment plan. - FLUoxetine (PROZAC) 20 MG capsule; Take 1 capsule (20 mg total) by mouth daily.  Dispense: 90 capsule; Refill: 3  7. Primary insomnia Stable. Diagnosis pulled for medication refill. Continue current medical treatment plan. - zolpidem (AMBIEN) 10 MG tablet; Take 1 tablet (10 mg total) by mouth at bedtime as needed  for sleep. 1/2 -1 tablet every night  Dispense: 90 tablet; Refill: 1  8. Screening for HIV without presence of risk factors - HIV antibody (with reflex)   --------------------------------------------------------------------    Margaretann Loveless, PA-C  [facility]

## 2016-10-16 ENCOUNTER — Telehealth: Payer: Self-pay

## 2016-10-16 LAB — COMPREHENSIVE METABOLIC PANEL
ALBUMIN: 4.5 g/dL (ref 3.6–4.8)
ALT: 19 IU/L (ref 0–32)
AST: 20 IU/L (ref 0–40)
Albumin/Globulin Ratio: 2.3 — ABNORMAL HIGH (ref 1.2–2.2)
Alkaline Phosphatase: 95 IU/L (ref 39–117)
BUN / CREAT RATIO: 13 (ref 12–28)
BUN: 12 mg/dL (ref 8–27)
Bilirubin Total: 0.5 mg/dL (ref 0.0–1.2)
CALCIUM: 9.8 mg/dL (ref 8.7–10.3)
CO2: 26 mmol/L (ref 18–29)
CREATININE: 0.96 mg/dL (ref 0.57–1.00)
Chloride: 101 mmol/L (ref 96–106)
GFR calc Af Amer: 74 mL/min/{1.73_m2} (ref >59)
GFR, EST NON AFRICAN AMERICAN: 64 mL/min/{1.73_m2} (ref >59)
GLOBULIN, TOTAL: 2 g/dL (ref 1.5–4.5)
Glucose: 88 mg/dL (ref 65–99)
Potassium: 4.3 mmol/L (ref 3.5–5.2)
SODIUM: 142 mmol/L (ref 134–144)
TOTAL PROTEIN: 6.5 g/dL (ref 6.0–8.5)

## 2016-10-16 LAB — CBC WITH DIFFERENTIAL/PLATELET
Basophils Absolute: 0 10*3/uL (ref 0.0–0.2)
Basos: 0 %
EOS (ABSOLUTE): 0.2 10*3/uL (ref 0.0–0.4)
EOS: 4 %
HEMATOCRIT: 38.5 % (ref 34.0–46.6)
HEMOGLOBIN: 12.9 g/dL (ref 11.1–15.9)
IMMATURE GRANULOCYTES: 0 %
Immature Grans (Abs): 0 10*3/uL (ref 0.0–0.1)
Lymphocytes Absolute: 2.1 10*3/uL (ref 0.7–3.1)
Lymphs: 34 %
MCH: 27.9 pg (ref 26.6–33.0)
MCHC: 33.5 g/dL (ref 31.5–35.7)
MCV: 83 fL (ref 79–97)
MONOCYTES: 8 %
Monocytes Absolute: 0.5 10*3/uL (ref 0.1–0.9)
NEUTROS PCT: 54 %
Neutrophils Absolute: 3.3 10*3/uL (ref 1.4–7.0)
Platelets: 220 10*3/uL (ref 150–379)
RBC: 4.63 x10E6/uL (ref 3.77–5.28)
RDW: 14.5 % (ref 12.3–15.4)
WBC: 6.2 10*3/uL (ref 3.4–10.8)

## 2016-10-16 LAB — LIPID PANEL
CHOL/HDL RATIO: 3.2 ratio (ref 0.0–4.4)
Cholesterol, Total: 189 mg/dL (ref 100–199)
HDL: 60 mg/dL (ref >39)
LDL Calculated: 109 mg/dL — ABNORMAL HIGH (ref 0–99)
Triglycerides: 101 mg/dL (ref 0–149)
VLDL CHOLESTEROL CAL: 20 mg/dL (ref 5–40)

## 2016-10-16 LAB — HEMOGLOBIN A1C
ESTIMATED AVERAGE GLUCOSE: 114 mg/dL
Hgb A1c MFr Bld: 5.6 % (ref 4.8–5.6)

## 2016-10-16 LAB — TSH: TSH: 3.48 u[IU]/mL (ref 0.450–4.500)

## 2016-10-16 LAB — HIV ANTIBODY (ROUTINE TESTING W REFLEX): HIV Screen 4th Generation wRfx: NONREACTIVE

## 2016-10-16 MED ORDER — LEVOTHYROXINE SODIUM 75 MCG PO TABS: 75.0000 ug | ORAL_TABLET | Freq: Every day | ORAL | 3 refills | Status: DC

## 2016-10-16 MED ORDER — ATORVASTATIN CALCIUM 10 MG PO TABS: 10.0000 mg | ORAL_TABLET | Freq: Every day | ORAL | 3 refills | Status: DC

## 2016-10-16 NOTE — Telephone Encounter (Signed)
-----   Message from Margaretann LovelessJennifer M Burnette, PA-C sent at 10/16/2016  8:30 AM EST ----- All labs are within normal limits and/or stable.  Thanks! -JB

## 2016-10-16 NOTE — Telephone Encounter (Signed)
Patient advised as directed below.  Thanks,  -Jahyra Sukup 

## 2016-10-21 LAB — PAP IG AND HPV HIGH-RISK
HPV, HIGH-RISK: NEGATIVE
PAP SMEAR COMMENT: 0

## 2016-10-22 ENCOUNTER — Telehealth: Payer: Self-pay

## 2016-10-22 NOTE — Telephone Encounter (Signed)
LMTCB Persephanie Laatsch Drozdowski, CMA  

## 2016-10-22 NOTE — Telephone Encounter (Signed)
-----   Message from Margaretann LovelessJennifer M Burnette, [place]PA-C sent at 10/21/2016  5:03 PM EST ----- Pap is normal, HPV negative. Mild atrophic changes noted which is consistent with menopause.  Will repeat in 3-5 years.

## 2016-10-24 NOTE — Telephone Encounter (Signed)
Patient has been advised. KW 

## 2016-11-06 ENCOUNTER — Encounter: Payer: Self-pay | Admitting: Physician Assistant

## 2016-11-07 ENCOUNTER — Telehealth: Payer: Self-pay | Admitting: Physician Assistant

## 2016-11-07 DIAGNOSIS — E039 Hypothyroidism, unspecified: Secondary | ICD-10-CM

## 2016-11-07 MED ORDER — LEVOTHYROXINE SODIUM 75 MCG PO TABS: 75.0000 ug | ORAL_TABLET | Freq: Every day | ORAL | 3 refills | Status: DC

## 2016-11-07 NOTE — Telephone Encounter (Signed)
New Rx for levothyroxine sent for patient since pharmacy is changing manufacturer of medication

## 2017-01-16 LAB — HM COLONOSCOPY

## 2017-04-09 ENCOUNTER — Encounter: Payer: Self-pay | Admitting: Physician Assistant

## 2017-04-09 DIAGNOSIS — F5101 Primary insomnia: Secondary | ICD-10-CM

## 2017-04-10 MED ORDER — ZOLPIDEM TARTRATE 10 MG PO TABS: 10.0000 mg | ORAL_TABLET | Freq: Every evening | ORAL | 1 refills | Status: DC | PRN

## 2017-04-10 NOTE — Telephone Encounter (Signed)
Please phone in Zolpidem 10mg  to take 1/2 tab to 1 tab PO q hs prn sleep #90 1 refill to [facility] [place] [phone]  Thanks.

## 2017-04-17 ENCOUNTER — Encounter: Payer: Self-pay | Admitting: Physician Assistant

## 2017-08-01 ENCOUNTER — Telehealth: Payer: Self-pay

## 2017-08-01 NOTE — Telephone Encounter (Signed)
[facility] wants to know if they can change generics?  For the Levothyroxine.  Please Review.  Thanks,  -Joseline

## 2017-08-01 NOTE — Telephone Encounter (Signed)
Ok to change.  May need to recheck TSH in 4-6 weeks to make sure dose is appropriate.

## 2017-08-04 ENCOUNTER — Telehealth: Payer: Self-pay

## 2017-08-04 NOTE — Telephone Encounter (Signed)
Called as directed below.  Thanks,  -Analyssa Downs

## 2017-08-04 NOTE — Telephone Encounter (Signed)
Patient was advised.Thanks,  -Safal Halderman

## 2017-08-04 NOTE — Telephone Encounter (Signed)
Ann[facility]Jackie [facility] needs a verbal okay to change the manufacture brand for Ms. Barron's Levothyroxine.   Contact number: [phone]  Thanks,   -Vernona RiegerLaura

## 2017-08-04 NOTE — Telephone Encounter (Signed)
See other note,  Thanks,  -Joseline

## 2017-08-04 NOTE — Telephone Encounter (Signed)
Should be phone note from 08/01/17 giving ok.   She will need tsh rechecked in 4-6 weeks after change.

## 2017-08-07 ENCOUNTER — Encounter: Payer: Self-pay | Admitting: Obstetrics and Gynecology

## 2017-08-07 ENCOUNTER — Ambulatory Visit (INDEPENDENT_AMBULATORY_CARE_PROVIDER_SITE_OTHER): Payer: BC Managed Care – PPO | Admitting: Obstetrics and Gynecology

## 2017-08-07 VITALS — BP 116/68 | HR 74 | Ht 62.0 in | Wt 138.0 lb

## 2017-08-07 DIAGNOSIS — N898 Other specified noninflammatory disorders of vagina: Secondary | ICD-10-CM | POA: Diagnosis not present

## 2017-08-07 DIAGNOSIS — L900 Lichen sclerosus et atrophicus: Secondary | ICD-10-CM | POA: Insufficient documentation

## 2017-08-07 DIAGNOSIS — L298 Other pruritus: Secondary | ICD-10-CM | POA: Diagnosis not present

## 2017-08-07 DIAGNOSIS — B001 Herpesviral vesicular dermatitis: Secondary | ICD-10-CM | POA: Insufficient documentation

## 2017-08-07 LAB — POCT WET PREP WITH KOH
Clue Cells Wet Prep HPF POC: NEGATIVE
KOH Prep POC: NEGATIVE
Trichomonas, UA: NEGATIVE
Yeast Wet Prep HPF POC: NEGATIVE

## 2017-08-07 MED ORDER — CLOBETASOL PROPIONATE 0.05 % EX OINT: 1.0000 "application " | TOPICAL_OINTMENT | Freq: Every day | CUTANEOUS | 0 refills | Status: DC

## 2017-08-07 NOTE — Progress Notes (Signed)
Chief Complaint  Patient presents with  . vaginal irritation    vaginal intense itching and burning    HPI:      Ms. Brittany Reese is a 61 y.o. No obstetric history on file. who LMP was No LMP recorded. Patient is postmenopausal., presents today for NP eval of severe vaginal itching and irritation for several months. No increased d/c, odor. She has dysuria due to urine hitting the skin. No other urin sx. She has treated with benzocaine and leftover aclovate crm without sx relief. She saw Dr. Luella Cook in 2000 for a vaginal lesion and a bx showed lichen sclerosis et atrophicus (CHART REVIEWED). He gave her the aclovate crm and pt has been fine till now. No yeast tx by pt. No recent abx use, no new soaps/detergents. Does use dryer sheets. She denies any vaginal bleeding/vag dryness. She is not sex active.  She has a hx of cold sores and has sx currently.    Past Medical History:  Diagnosis Date  . Depression   . Hyperlipidemia   . Thyroid disease     History reviewed. No pertinent surgical history.  Family History  Problem Relation Age of Onset  . COPD Mother   . Hypertension Mother   . Heart disease Mother   . Kidney disease Father   . Healthy Brother   . Healthy Brother     Social History   Social History  . Marital status: Married    Spouse name: N/A  . Number of children: N/A  . Years of education: N/A   Occupational History  . Not on file.   Social History Main Topics  . Smoking status: Former Smoker    Types: Cigarettes    Quit date: 10/18/2014  . Smokeless tobacco: Never Used  . Alcohol use 1.8 oz/week    3 Cans of beer per week     Comment: occasional  . Drug use: No  . Sexual activity: No   Other Topics Concern  . Not on file   Social History Narrative  . No narrative on file     Current Outpatient Prescriptions:  .  atorvastatin (LIPITOR) 10 MG tablet, Take 1 tablet (10 mg total) by mouth at bedtime., Disp: 90 tablet, Rfl: 3 .  FLUoxetine  (PROZAC) 20 MG capsule, Take 1 capsule (20 mg total) by mouth daily., Disp: 90 capsule, Rfl: 3 .  levothyroxine (SYNTHROID, LEVOTHROID) 75 MCG tablet, Take 1 tablet (75 mcg total) by mouth daily before breakfast., Disp: 90 tablet, Rfl: 3 .  zolpidem (AMBIEN) 10 MG tablet, Take 1 tablet (10 mg total) by mouth at bedtime as needed for sleep. 1/2 -1 tablet every night, Disp: 90 tablet, Rfl: 1 .  clobetasol ointment (TEMOVATE) 0.05 %, Apply 1 application topically at bedtime., Disp: 45 g, Rfl: 0   ROS:  Review of Systems  Constitutional: Negative for fever.  Gastrointestinal: Negative for blood in stool, constipation, diarrhea, nausea and vomiting.  Genitourinary: Positive for dysuria, genital sores and vaginal pain. Negative for dyspareunia, flank pain, frequency, hematuria, urgency, vaginal bleeding and vaginal discharge.  Musculoskeletal: Negative for back pain.  Skin: Negative for rash.     OBJECTIVE:   Vitals:  BP 116/68 (BP Location: Left Arm, Patient Position: Sitting, Cuff Size: Normal)   Pulse 74   Ht  (1.575 m)   Wt 138 lb (62.6 kg)   BMI 25.24 kg/m   Physical Exam  Constitutional: She is oriented to person, place, and time  and well-developed, well-nourished, and in no distress. Vital signs are normal.  Genitourinary: Uterus normal, cervix normal, right adnexa normal and left adnexa normal. Uterus is not enlarged. Cervix exhibits no motion tenderness and no tenderness. Right adnexum displays no mass and no tenderness. Left adnexum displays no mass and no tenderness. Vulva exhibits lesion. Vulva exhibits no erythema, no exudate, no rash and no tenderness. Vagina exhibits no lesion. Thin  odorless  yellow and vaginal discharge found.  Genitourinary Comments: BILAT LABIA MAJORA ARE HYPERTROPHIED AND PALE WITH A COUPLE FISSUES AND 2 ULCERATIVE LESION; D/C FROM LESIONS  Neurological: She is oriented to person, place, and time.    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 Rx clobetasol QHS for 4 wks. RTO at that time to re-eval and either continue nightly tx vs taper down for 12 wk total tx.  - Plan: clobetasol ointment (TEMOVATE) 0.05 %  Vaginal itching - Cold compresses to stop itch.  - Plan: POCT Wet Prep with KOH  Vaginal lesion - Question excoriation vs HSV. HSV culture done today. Will call pt with results.  - Plan: Other/Misc lab test    Meds ordered this encounter  Medications  . clobetasol ointment (TEMOVATE) 0.05 %    Sig: Apply 1 application topically at bedtime.    Dispense:  45 g    Refill:  0      Return in about 4 weeks (around 09/04/2017), or if symptoms worsen or fail to improve, for pelvic f/u.  Alicia B. Copland, PA-C 08/07/2017 2:46 PM

## 2017-08-12 ENCOUNTER — Telehealth: Payer: Self-pay | Admitting: Obstetrics and Gynecology

## 2017-08-12 NOTE — Telephone Encounter (Signed)
LM with neg HSV culture results. Pt with LS and lesions are most likely from scratching. F/u in a few wks/sooner prn.

## 2017-08-27 ENCOUNTER — Other Ambulatory Visit: Payer: Self-pay | Admitting: Physician Assistant

## 2017-08-27 DIAGNOSIS — Z1231 Encounter for screening mammogram for malignant neoplasm of breast: Secondary | ICD-10-CM

## 2017-09-04 ENCOUNTER — Ambulatory Visit: Payer: BC Managed Care – PPO | Admitting: Obstetrics and Gynecology

## 2017-09-08 ENCOUNTER — Ambulatory Visit
Admission: RE | Admit: 2017-09-08 | Discharge: 2017-09-08 | Disposition: A | Discharge status: Home / Self Care | Payer: BC Managed Care – PPO | Source: Ambulatory Visit | Attending: Physician Assistant | Admitting: Physician Assistant

## 2017-09-08 ENCOUNTER — Telehealth: Payer: Self-pay

## 2017-09-08 DIAGNOSIS — Z1231 Encounter for screening mammogram for malignant neoplasm of breast: Secondary | ICD-10-CM | POA: Insufficient documentation

## 2017-09-08 NOTE — Telephone Encounter (Signed)
Patient advised as below.  

## 2017-09-08 NOTE — Telephone Encounter (Signed)
-----   Message from Margaretann Loveless, [place] sent at 09/08/2017  4:38 PM EDT ----- Normal mammogram. Repeat screening in one year.

## 2017-10-23 ENCOUNTER — Ambulatory Visit (INDEPENDENT_AMBULATORY_CARE_PROVIDER_SITE_OTHER): Payer: BC Managed Care – PPO | Admitting: Physician Assistant

## 2017-10-23 ENCOUNTER — Encounter: Payer: Self-pay | Admitting: Physician Assistant

## 2017-10-23 VITALS — BP 122/60 | HR 76 | Temp 98.2°F | Resp 16 | Ht 62.0 in | Wt 138.0 lb

## 2017-10-23 DIAGNOSIS — Z1231 Encounter for screening mammogram for malignant neoplasm of breast: Secondary | ICD-10-CM

## 2017-10-23 DIAGNOSIS — E785 Hyperlipidemia, unspecified: Secondary | ICD-10-CM | POA: Diagnosis not present

## 2017-10-23 DIAGNOSIS — E039 Hypothyroidism, unspecified: Secondary | ICD-10-CM

## 2017-10-23 DIAGNOSIS — Z1239 Encounter for other screening for malignant neoplasm of breast: Secondary | ICD-10-CM

## 2017-10-23 DIAGNOSIS — F3341 Major depressive disorder, recurrent, in partial remission: Secondary | ICD-10-CM | POA: Diagnosis not present

## 2017-10-23 DIAGNOSIS — Z0000 Encounter for general adult medical examination without abnormal findings: Secondary | ICD-10-CM

## 2017-10-23 DIAGNOSIS — L900 Lichen sclerosus et atrophicus: Secondary | ICD-10-CM | POA: Diagnosis not present

## 2017-10-23 DIAGNOSIS — E559 Vitamin D deficiency, unspecified: Secondary | ICD-10-CM | POA: Diagnosis not present

## 2017-10-23 DIAGNOSIS — F5101 Primary insomnia: Secondary | ICD-10-CM | POA: Diagnosis not present

## 2017-10-23 MED ORDER — LEVOTHYROXINE SODIUM 75 MCG PO TABS: 75.0000 ug | ORAL_TABLET | Freq: Every day | ORAL | 3 refills | Status: DC

## 2017-10-23 MED ORDER — FLUOXETINE HCL 20 MG PO CAPS: 20.0000 mg | ORAL_CAPSULE | Freq: Every day | ORAL | 3 refills | Status: DC

## 2017-10-23 MED ORDER — ATORVASTATIN CALCIUM 10 MG PO TABS: 10.0000 mg | ORAL_TABLET | Freq: Every day | ORAL | 3 refills | Status: DC

## 2017-10-23 MED ORDER — ZOLPIDEM TARTRATE 10 MG PO TABS: 10.0000 mg | ORAL_TABLET | Freq: Every evening | ORAL | 1 refills | Status: DC | PRN

## 2017-10-23 NOTE — Progress Notes (Signed)
Patient: Brittany Reese, Female    DOB: 1956-07-27, 61 y.o.   MRN: [id number] Visit Date: 10/23/2017  Today's Provider: Margaretann Loveless, PA-C   Chief Complaint  Patient presents with  . Annual Exam   Subjective:    Annual physical exam Brittany Reese is a 61 y.o. female who presents today for health maintenance and complete physical. She feels well. She reports exercising none. She reports she is sleeping well with the Ambien.  10/15/16: CPE 10/15/16:Pap-Negative,HPV-Neg Mammogram:09/08/17 BI-RADS 1 Colonoscopy:10/30/11 Diverticulosis,Int.Hemorrhoids, Recheck in 5 yrs. -----------------------------------------------------------------   Review of Systems  Constitutional: Negative.   HENT: Negative.   Eyes: Negative.   Respiratory: Negative.   Cardiovascular: Negative.   Gastrointestinal: Negative.   Endocrine: Negative.   Genitourinary: Negative.   Musculoskeletal: Negative.   Skin: Negative.   Allergic/Immunologic: Negative.   Neurological: Negative.   Hematological: Negative.   Psychiatric/Behavioral: Negative.     Social History      She  reports that she quit smoking about 3 years ago. Her smoking use included cigarettes. she has never used smokeless tobacco. She reports that she drinks about 1.8 oz of alcohol per week. She reports that she does not use drugs.       Social History   Socioeconomic History  . Marital status: Married    Spouse name: None  . Number of children: None  . Years of education: None  . Highest education level: None  Social Needs  . Financial resource strain: None  . Food insecurity - worry: None  . Food insecurity - inability: None  . Transportation needs - medical: None  . Transportation needs - non-medical: None  Occupational History  . None  Tobacco Use  . Smoking status: Former Smoker    Types: Cigarettes    Last attempt to quit: 10/18/2014    Years since quitting: 3.0  . Smokeless tobacco: Never Used    Substance and Sexual Activity  . Alcohol use: Yes    Alcohol/week: 1.8 oz    Types: 3 Cans of beer per week    Comment: occasional  . Drug use: No  . Sexual activity: No    Birth control/protection: None  Other Topics Concern  . None  Social History Narrative  . None    Past Medical History:  Diagnosis Date  . Depression   . Hyperlipidemia   . Thyroid disease      Patient Active Problem List   Diagnosis Date Noted  . Lichen sclerosus et atrophicus 08/07/2017  . Cold sore 08/07/2017  . Arthropathia 10/05/2015  . Blood in the urine 10/05/2015  . Cannot sleep 10/05/2015  . Restless leg 10/05/2015  . Avitaminosis D 08/11/2009  . Addison's keloid 08/07/2009  . Clinical depression 05/17/2009  . HLD (hyperlipidemia) 05/17/2009  . Adult hypothyroidism 05/17/2009  . Osteopenia 05/17/2009  . History of colon polyps 12/12/2006  . History of tobacco use 11/25/1988    History reviewed. No pertinent surgical history.  Family History        Family Status  Relation Name Status  . Mother  Deceased  . Father  Deceased  . Brother  Alive  . Brother  Alive        Her family history includes COPD in her mother; Healthy in her brother and brother; Heart disease in her mother; Hypertension in her mother; Kidney disease in her father.     No Known Allergies   Current Outpatient Medications:  .  atorvastatin (LIPITOR) 10 MG tablet, Take 1 tablet (10 mg total) by mouth at bedtime., Disp: 90 tablet, Rfl: 3 .  clobetasol ointment (TEMOVATE) 0.05 %, Apply 1 application topically at bedtime., Disp: 45 g, Rfl: 0 .  FLUoxetine (PROZAC) 20 MG capsule, Take 1 capsule (20 mg total) by mouth daily., Disp: 90 capsule, Rfl: 3 .  levothyroxine (SYNTHROID, LEVOTHROID) 75 MCG tablet, Take 1 tablet (75 mcg total) by mouth daily before breakfast., Disp: 90 tablet, Rfl: 3 .  zolpidem (AMBIEN) 10 MG tablet, Take 1 tablet (10 mg total) by mouth at bedtime as needed for sleep. 1/2 -1 tablet every  night, Disp: 90 tablet, Rfl: 1   Patient Care Team: Margaretann LovelessBurnette, Johnathin Vanderschaaf M, PA-C as PCP - General (Family Medicine)      Objective:   Vitals: BP 122/60 (BP Location: Left Arm, Patient Position: Sitting, Cuff Size: Normal)   Pulse 76   Temp 98.2 F (36.8 C) (Oral)   Resp 16   Ht 5\' 2"  (1.575 m)   Wt 138 lb (62.6 kg)   BMI 25.24 kg/m     Physical Exam  Constitutional: She is oriented to person, place, and time. She appears well-developed and well-nourished. No distress.  HENT:  Head: Normocephalic and atraumatic.  Right Ear: Hearing, tympanic membrane, external ear and ear canal normal.  Left Ear: Hearing, tympanic membrane, external ear and ear canal normal.  Nose: Nose normal.  Mouth/Throat: Uvula is midline, oropharynx is clear and moist and mucous membranes are normal. No oropharyngeal exudate.  Eyes: Conjunctivae and EOM are normal. Pupils are equal, round, and reactive to light. Right eye exhibits no discharge. Left eye exhibits no discharge. No scleral icterus.  Neck: Normal range of motion. Neck supple. No JVD present. Carotid bruit is not present. No tracheal deviation present. No thyromegaly present.  Cardiovascular: Normal rate, regular rhythm, normal heart sounds and intact distal pulses. Exam reveals no gallop and no friction rub.  No murmur heard. Pulmonary/Chest: Effort normal and breath sounds normal. No respiratory distress. She has no wheezes. She has no rales. She exhibits no tenderness. Right breast exhibits no inverted nipple, no mass, no nipple discharge, no skin change and no tenderness. Left breast exhibits no inverted nipple, no mass, no nipple discharge, no skin change and no tenderness. Breasts are symmetrical.  Abdominal: Soft. Bowel sounds are normal. She exhibits no distension and no mass. There is no tenderness. There is no rebound and no guarding.  Musculoskeletal: Normal range of motion. She exhibits no edema or tenderness.  Lymphadenopathy:    She has  no cervical adenopathy.  Neurological: She is alert and oriented to person, place, and time.  Skin: Skin is warm and dry. No rash noted. She is not diaphoretic.  Psychiatric: She has a normal mood and affect. Her behavior is normal. Judgment and thought content normal.  Vitals reviewed.    Depression Screen PHQ 2/9 Scores 10/23/2017 10/06/2015  PHQ - 2 Score - 0  Exception Documentation Patient refusal -      Assessment & Plan:     Routine Health Maintenance and Physical Exam  Exercise Activities and Dietary recommendations Goals    . Exercise 150 minutes per week (moderate activity)       Immunization History  Administered Date(s) Administered  . Influenza,inj,Quad PF,6+ Mos 10/06/2015  . Influenza-Unspecified 07/21/2017  . Tdap 04/29/2005, 07/21/2017    Health Maintenance  Topic Date Due  . MAMMOGRAM  09/08/2018  . PAP SMEAR  10/16/2019  .  COLONOSCOPY  10/29/2021  . TETANUS/TDAP  07/22/2027  . INFLUENZA VACCINE  Completed  . Hepatitis C Screening  Completed  . HIV Screening  Completed     Discussed health benefits of physical activity, and encouraged her to engage in regular exercise appropriate for her age and condition.    1. Annual physical exam Normal physical exam today. Will check labs as below and f/u pending lab results. If labs are stable and WNL she will not need to have these rechecked for one year at her next annual physical exam. She is to call the office in the meantime if she has any acute issue, questions or concerns. - CBC with Differential/Platelet - Comprehensive metabolic panel - Hemoglobin A1c  2. Breast cancer screening Normal exam today. Mammogram normal from 08/2017.  3. Adult hypothyroidism Stable on levothyroxine 75mcg. Will check labs as below and f/u pending results. - TSH - T4 - levothyroxine (SYNTHROID, LEVOTHROID) 75 MCG tablet; Take 1 tablet (75 mcg total) by mouth daily before breakfast.  Dispense: 90 tablet; Refill:  3  4. Hyperlipidemia, unspecified hyperlipidemia type Stable on atorvastatin 10mg . Will check labs as below and f/u pending results. - Comprehensive metabolic panel - Hemoglobin A1c - Lipid panel - atorvastatin (LIPITOR) 10 MG tablet; Take 1 tablet (10 mg total) by mouth at bedtime.  Dispense: 90 tablet; Refill: 3  5. Avitaminosis D H/O this but not on current supplementation. Will check labs as below and f/u pending results. - CBC with Differential/Platelet - Vitamin D (25 hydroxy)  6. Recurrent major depressive disorder, in partial remission (HCC) Stable. Diagnosis pulled for medication refill. Continue current medical treatment plan. - FLUoxetine (PROZAC) 20 MG capsule; Take 1 capsule (20 mg total) by mouth daily.  Dispense: 90 capsule; Refill: 3  7. Primary insomnia Stable. Diagnosis pulled for medication refill. Continue current medical treatment plan. - zolpidem (AMBIEN) 10 MG tablet; Take 1 tablet (10 mg total) by mouth at bedtime as needed for sleep. 1/2 -1 tablet every night  Dispense: 90 tablet; Refill: 1  8. Lichen sclerosus et atrophicus Followed by Althea GrimmerAlicia Copland, [facility].   --------------------------------------------------------------------    Margaretann LovelessJennifer M Ashleigh Luckow, PA-C  [facility]

## 2017-10-23 NOTE — Patient Instructions (Signed)
Health Maintenance for Postmenopausal Women Menopause is a normal process in which your reproductive ability comes to an end. This process happens gradually over a span of months to years, usually between the ages of 22 and 9. Menopause is complete when you have missed 12 consecutive menstrual periods. It is important to talk with your health care provider about some of the most common conditions that affect postmenopausal women, such as heart disease, cancer, and bone loss (osteoporosis). Adopting a healthy lifestyle and getting preventive care can help to promote your health and wellness. Those actions can also lower your chances of developing some of these common conditions. What should I know about menopause? During menopause, you may experience a number of symptoms, such as:  Moderate-to-severe hot flashes.  Night sweats.  Decrease in sex drive.  Mood swings.  Headaches.  Tiredness.  Irritability.  Memory problems.  Insomnia.  Choosing to treat or not to treat menopausal changes is an individual decision that you make with your health care provider. What should I know about hormone replacement therapy and supplements? Hormone therapy products are effective for treating symptoms that are associated with menopause, such as hot flashes and night sweats. Hormone replacement carries certain risks, especially as you become older. If you are thinking about using estrogen or estrogen with progestin treatments, discuss the benefits and risks with your health care provider. What should I know about heart disease and stroke? Heart disease, heart attack, and stroke become more likely as you age. This may be due, in part, to the hormonal changes that your body experiences during menopause. These can affect how your body processes dietary fats, triglycerides, and cholesterol. Heart attack and stroke are both medical emergencies. There are many things that you can do to help prevent heart disease  and stroke:  Have your blood pressure checked at least every 1-2 years. High blood pressure causes heart disease and increases the risk of stroke.  If you are 53-22 years old, ask your health care provider if you should take aspirin to prevent a heart attack or a stroke.  Do not use any tobacco products, including cigarettes, chewing tobacco, or electronic cigarettes. If you need help quitting, ask your health care provider.  It is important to eat a healthy diet and maintain a healthy weight. ? Be sure to include plenty of vegetables, fruits, low-fat dairy products, and lean protein. ? Avoid eating foods that are high in solid fats, added sugars, or salt (sodium).  Get regular exercise. This is one of the most important things that you can do for your health. ? Try to exercise for at least 150 minutes each week. The type of exercise that you do should increase your heart rate and make you sweat. This is known as moderate-intensity exercise. ? Try to do strengthening exercises at least twice each week. Do these in addition to the moderate-intensity exercise.  Know your numbers.Ask your health care provider to check your cholesterol and your blood glucose. Continue to have your blood tested as directed by your health care provider.  What should I know about cancer screening? There are several types of cancer. Take the following steps to reduce your risk and to catch any cancer development as early as possible. Breast Cancer  Practice breast self-awareness. ? This means understanding how your breasts normally appear and feel. ? It also means doing regular breast self-exams. Let your health care provider know about any changes, no matter how small.  If you are 40  or older, have a clinician do a breast exam (clinical breast exam or CBE) every year. Depending on your age, family history, and medical history, it may be recommended that you also have a yearly breast X-ray (mammogram).  If you  have a family history of breast cancer, talk with your health care provider about genetic screening.  If you are at high risk for breast cancer, talk with your health care provider about having an MRI and a mammogram every year.  Breast cancer (BRCA) gene test is recommended for women who have family members with BRCA-related cancers. Results of the assessment will determine the need for genetic counseling and BRCA1 and for BRCA2 testing. BRCA-related cancers include these types: ? Breast. This occurs in males or females. ? Ovarian. ? Tubal. This may also be called fallopian tube cancer. ? Cancer of the abdominal or pelvic lining (peritoneal cancer). ? Prostate. ? Pancreatic.  Cervical, Uterine, and Ovarian Cancer Your health care provider may recommend that you be screened regularly for cancer of the pelvic organs. These include your ovaries, uterus, and vagina. This screening involves a pelvic exam, which includes checking for microscopic changes to the surface of your cervix (Pap test).  For women ages 21-65, health care providers may recommend a pelvic exam and a Pap test every three years. For women ages 79-65, they may recommend the Pap test and pelvic exam, combined with testing for human papilloma virus (HPV), every five years. Some types of HPV increase your risk of cervical cancer. Testing for HPV may also be done on women of any age who have unclear Pap test results.  Other health care providers may not recommend any screening for nonpregnant women who are considered low risk for pelvic cancer and have no symptoms. Ask your health care provider if a screening pelvic exam is right for you.  If you have had past treatment for cervical cancer or a condition that could lead to cancer, you need Pap tests and screening for cancer for at least 20 years after your treatment. If Pap tests have been discontinued for you, your risk factors (such as having a new sexual partner) need to be  reassessed to determine if you should start having screenings again. Some women have medical problems that increase the chance of getting cervical cancer. In these cases, your health care provider may recommend that you have screening and Pap tests more often.  If you have a family history of uterine cancer or ovarian cancer, talk with your health care provider about genetic screening.  If you have vaginal bleeding after reaching menopause, tell your health care provider.  There are currently no reliable tests available to screen for ovarian cancer.  Lung Cancer Lung cancer screening is recommended for adults 69-62 years old who are at high risk for lung cancer because of a history of smoking. A yearly low-dose CT scan of the lungs is recommended if you:  Currently smoke.  Have a history of at least 30 pack-years of smoking and you currently smoke or have quit within the past 15 years. A pack-year is smoking an average of one pack of cigarettes per day for one year.  Yearly screening should:  Continue until it has been 15 years since you quit.  Stop if you develop a health problem that would prevent you from having lung cancer treatment.  Colorectal Cancer  This type of cancer can be detected and can often be prevented.  Routine colorectal cancer screening usually begins at  age 42 and continues through age 45.  If you have risk factors for colon cancer, your health care provider may recommend that you be screened at an earlier age.  If you have a family history of colorectal cancer, talk with your health care provider about genetic screening.  Your health care provider may also recommend using home test kits to check for hidden blood in your stool.  A small camera at the end of a tube can be used to examine your colon directly (sigmoidoscopy or colonoscopy). This is done to check for the earliest forms of colorectal cancer.  Direct examination of the colon should be repeated every  5-10 years until age 71. However, if early forms of precancerous polyps or small growths are found or if you have a family history or genetic risk for colorectal cancer, you may need to be screened more often.  Skin Cancer  Check your skin from head to toe regularly.  Monitor any moles. Be sure to tell your health care provider: ? About any new moles or changes in moles, especially if there is a change in a mole's shape or color. ? If you have a mole that is larger than the size of a pencil eraser.  If any of your family members has a history of skin cancer, especially at a young age, talk with your health care provider about genetic screening.  Always use sunscreen. Apply sunscreen liberally and repeatedly throughout the day.  Whenever you are outside, protect yourself by wearing long sleeves, pants, a wide-brimmed hat, and sunglasses.  What should I know about osteoporosis? Osteoporosis is a condition in which bone destruction happens more quickly than new bone creation. After menopause, you may be at an increased risk for osteoporosis. To help prevent osteoporosis or the bone fractures that can happen because of osteoporosis, the following is recommended:  If you are 46-71 years old, get at least 1,000 mg of calcium and at least 600 mg of vitamin D per day.  If you are older than age 55 but younger than age 65, get at least 1,200 mg of calcium and at least 600 mg of vitamin D per day.  If you are older than age 54, get at least 1,200 mg of calcium and at least 800 mg of vitamin D per day.  Smoking and excessive alcohol intake increase the risk of osteoporosis. Eat foods that are rich in calcium and vitamin D, and do weight-bearing exercises several times each week as directed by your health care provider. What should I know about how menopause affects my mental health? Depression may occur at any age, but it is more common as you become older. Common symptoms of depression  include:  Low or sad mood.  Changes in sleep patterns.  Changes in appetite or eating patterns.  Feeling an overall lack of motivation or enjoyment of activities that you previously enjoyed.  Frequent crying spells.  Talk with your health care provider if you think that you are experiencing depression. What should I know about immunizations? It is important that you get and maintain your immunizations. These include:  Tetanus, diphtheria, and pertussis (Tdap) booster vaccine.  Influenza every year before the flu season begins.  Pneumonia vaccine.  Shingles vaccine.  Your health care provider may also recommend other immunizations. This information is not intended to replace advice given to you by your health care provider. Make sure you discuss any questions you have with your health care provider. Document Released: 01/03/2006  Document Revised: 05/31/2016 Document Reviewed: 08/15/2015 Elsevier Interactive Patient Education  [employer].

## 2017-10-24 ENCOUNTER — Telehealth: Payer: Self-pay

## 2017-10-24 LAB — COMPLETE METABOLIC PANEL WITH GFR
AG Ratio: 1.9 (calc) (ref 1.0–2.5)
ALKALINE PHOSPHATASE (APISO): 93 U/L (ref 33–130)
ALT: 16 U/L (ref 6–29)
AST: 19 U/L (ref 10–35)
Albumin: 4.5 g/dL (ref 3.6–5.1)
BUN: 12 mg/dL (ref 7–25)
CALCIUM: 9.7 mg/dL (ref 8.6–10.4)
CO2: 28 mmol/L (ref 20–32)
CREATININE: 0.99 mg/dL (ref 0.50–0.99)
Chloride: 104 mmol/L (ref 98–110)
GFR, EST NON AFRICAN AMERICAN: 61 mL/min/{1.73_m2} (ref >=60)
GFR, Est African American: 71 mL/min/{1.73_m2} (ref >=60)
GLOBULIN: 2.4 g/dL (ref 1.9–3.7)
GLUCOSE: 91 mg/dL (ref 65–99)
Potassium: 4.3 mmol/L (ref 3.5–5.3)
Sodium: 140 mmol/L (ref 135–146)
Total Bilirubin: 0.6 mg/dL (ref 0.2–1.2)
Total Protein: 6.9 g/dL (ref 6.1–8.1)

## 2017-10-24 LAB — CBC WITH DIFFERENTIAL/PLATELET
BASOS PCT: 0.6 %
Basophils Absolute: 29 cells/uL (ref 0–200)
EOS PCT: 2.3 %
Eosinophils Absolute: 113 cells/uL (ref 15–500)
HCT: 41.7 % (ref 35.0–45.0)
Hemoglobin: 13.7 g/dL (ref 11.7–15.5)
Lymphs Abs: 1666 cells/uL (ref 850–3900)
MCH: 27.5 pg (ref 27.0–33.0)
MCHC: 32.9 g/dL (ref 32.0–36.0)
MCV: 83.6 fL (ref 80.0–100.0)
MONOS PCT: 8.6 %
MPV: 10.2 fL (ref 7.5–12.5)
NEUTROS PCT: 54.5 %
Neutro Abs: 2671 cells/uL (ref 1500–7800)
PLATELETS: 216 10*3/uL (ref 140–400)
RBC: 4.99 10*6/uL (ref 3.80–5.10)
RDW: 12.8 % (ref 11.0–15.0)
TOTAL LYMPHOCYTE: 34 %
WBC mixed population: 421 cells/uL (ref 200–950)
WBC: 4.9 10*3/uL (ref 3.8–10.8)

## 2017-10-24 LAB — HEMOGLOBIN A1C
HEMOGLOBIN A1C: 5.5 %{Hb} (ref <5.7)
MEAN PLASMA GLUCOSE: 111 (calc)
eAG (mmol/L): 6.2 (calc)

## 2017-10-24 LAB — VITAMIN D 25 HYDROXY (VIT D DEFICIENCY, FRACTURES): VIT D 25 HYDROXY: 37 ng/mL (ref 30–100)

## 2017-10-24 LAB — LIPID PANEL
Cholesterol: 200 mg/dL — ABNORMAL HIGH (ref <200)
HDL: 55 mg/dL (ref >50)
LDL Cholesterol (Calc): 119 mg/dL (calc) — ABNORMAL HIGH
NON-HDL CHOLESTEROL (CALC): 145 mg/dL — AB (ref <130)
TRIGLYCERIDES: 148 mg/dL (ref <150)
Total CHOL/HDL Ratio: 3.6 (calc) (ref <5.0)

## 2017-10-24 LAB — T4: T4, Total: 8.5 ug/dL (ref 5.1–11.9)

## 2017-10-24 LAB — TSH: TSH: 1.9 m[IU]/L (ref 0.40–4.50)

## 2017-10-24 NOTE — Progress Notes (Signed)
Advised  ED 

## 2017-10-24 NOTE — Telephone Encounter (Signed)
Patient advised as directed below.  Thanks,  -Dameisha Tschida 

## 2017-10-24 NOTE — Telephone Encounter (Signed)
-----   Message from Margaretann LovelessJennifer M Burnette, [place]PA-C sent at 10/24/2017  9:27 AM EST ----- All labs are within normal limits and stable.  Thanks! -JB

## 2017-11-11 ENCOUNTER — Encounter: Payer: Self-pay | Admitting: Unknown Physician Specialty

## 2017-12-05 ENCOUNTER — Encounter: Payer: Self-pay | Admitting: Physician Assistant

## 2018-04-27 ENCOUNTER — Encounter: Payer: Self-pay | Admitting: Physician Assistant

## 2018-04-27 ENCOUNTER — Other Ambulatory Visit: Payer: Self-pay | Admitting: Physician Assistant

## 2018-04-27 DIAGNOSIS — F5101 Primary insomnia: Secondary | ICD-10-CM

## 2018-05-13 ENCOUNTER — Ambulatory Visit (INDEPENDENT_AMBULATORY_CARE_PROVIDER_SITE_OTHER): Payer: BC Managed Care – PPO | Admitting: Physician Assistant

## 2018-05-13 ENCOUNTER — Encounter: Payer: Self-pay | Admitting: Physician Assistant

## 2018-05-13 VITALS — BP 138/80 | HR 76 | Temp 98.3°F | Resp 16 | Wt 134.0 lb

## 2018-05-13 DIAGNOSIS — E039 Hypothyroidism, unspecified: Secondary | ICD-10-CM

## 2018-05-13 DIAGNOSIS — M791 Myalgia, unspecified site: Secondary | ICD-10-CM | POA: Diagnosis not present

## 2018-05-13 DIAGNOSIS — E559 Vitamin D deficiency, unspecified: Secondary | ICD-10-CM

## 2018-05-13 DIAGNOSIS — R52 Pain, unspecified: Secondary | ICD-10-CM | POA: Diagnosis not present

## 2018-05-13 DIAGNOSIS — W57XXXA Bitten or stung by nonvenomous insect and other nonvenomous arthropods, initial encounter: Secondary | ICD-10-CM | POA: Diagnosis not present

## 2018-05-13 DIAGNOSIS — R5383 Other fatigue: Secondary | ICD-10-CM | POA: Diagnosis not present

## 2018-05-13 NOTE — Progress Notes (Signed)
Patient: Brittany Reese Female    DOB: June 06, 1956   62 y.o.   MRN: [id number] Visit Date: 05/13/2018  Today's Provider: Margaretann LovelessJennifer M Rylyn Ranganathan, PA-C   Chief Complaint  Patient presents with  . Fatigue   Subjective:    HPI Patient here today C/O fatigue on and off x's one week. Patient reports sleeping well taking Ambien nightly. Patient reports good appetite no headache. Patient denies any rash, fever, nausea or vomiting. Patient reports leg and arm soreness. Only thing she mentions that has occurred in the time frame is she stopped her prozac 2 weeks ago (tapered over one week prior) and she has had 2 tick bites but that occurred just 2 days ago, fatigue has been going on longer.   Will get up and mop and then become so exhausted she has to sit down and rest, then will get up an do more, and rest again. She also notices forearms feel like she has been gripping something all night long and her legs feel like she has been standing all day.      No Known Allergies   Current Outpatient Medications:  .  atorvastatin (LIPITOR) 10 MG tablet, Take 1 tablet (10 mg total) by mouth at bedtime., Disp: 90 tablet, Rfl: 3 .  clobetasol ointment (TEMOVATE) 0.05 %, Apply 1 application topically at bedtime., Disp: 45 g, Rfl: 0 .  levothyroxine (SYNTHROID, LEVOTHROID) 75 MCG tablet, Take 1 tablet (75 mcg total) by mouth daily before breakfast., Disp: 90 tablet, Rfl: 3 .  zolpidem (AMBIEN) 10 MG tablet, TAKE 1/2 TO 1 (ONE-HALF TO ONE) TABLET BY MOUTH AT NIGHT AS NEEDED FOR SLEEP, Disp: 30 tablet, Rfl: 5 .  FLUoxetine (PROZAC) 20 MG capsule, Take 1 capsule (20 mg total) by mouth daily. (Patient not taking: Reported on 05/13/2018), Disp: 90 capsule, Rfl: 3  Review of Systems  Constitutional: Positive for fatigue.  HENT: Negative.   Respiratory: Negative for cough, chest tightness, shortness of breath and wheezing.   Cardiovascular: Negative.   Gastrointestinal: Negative.   Genitourinary:  Negative.   Musculoskeletal: Positive for arthralgias and myalgias.  Skin: Negative.   Neurological: Negative.     Social History   Tobacco Use  . Smoking status: Former Smoker    Types: Cigarettes    Last attempt to quit: 10/18/2014    Years since quitting: 3.5  . Smokeless tobacco: Never Used  Substance Use Topics  . Alcohol use: Yes    Alcohol/week: 1.8 oz    Types: 3 Cans of beer per week    Comment: occasional   Objective:   BP 138/80 (BP Location: Left Arm, Patient Position: Sitting, Cuff Size: Normal)   Pulse 76   Temp 98.3 F (36.8 C) (Oral)   Resp 16   Wt 134 lb (60.8 kg)   SpO2 99%   BMI 24.51 kg/m  Vitals:   05/13/18 1059  BP: 138/80  Pulse: 76  Resp: 16  Temp: 98.3 F (36.8 C)  TempSrc: Oral  SpO2: 99%  Weight: 134 lb (60.8 kg)     Physical Exam  Constitutional: She appears well-developed and well-nourished. No distress.  Neck: Normal range of motion. Neck supple. No JVD present. No tracheal deviation present. No thyromegaly present.  Cardiovascular: Normal rate, regular rhythm and normal heart sounds. Exam reveals no gallop and no friction rub.  No murmur heard. Pulmonary/Chest: Effort normal and breath sounds normal. No respiratory distress. She has no wheezes. She has no rales.  Musculoskeletal: She exhibits no edema.  Lymphadenopathy:    She has no cervical adenopathy.  Skin: She is not diaphoretic.  Vitals reviewed.      Assessment & Plan:     1. Fatigue, unspecified type Unsure of source. Will check labs as below. Will check CK to r/o caused by statin. May consider stopping statin. Also want to make sure TSH in normal range. Also patient has h/o Vit D def so want to make sure that is normal as well. Patient also would like to R/O autoimmune causes. Will check labs as below and f/u pending results. - ANA,IFA RA Diag Pnl w/rflx Tit/Patn - CBC w/Diff/Platelet - Basic Metabolic Panel (BMET) - TSH - B. burgdorfi antibodies - B12 -  Vitamin D (25 hydroxy)  2. Myalgia - ANA,IFA RA Diag Pnl w/rflx Tit/Patn - CBC w/Diff/Platelet - Basic Metabolic Panel (BMET) - TSH - B. burgdorfi antibodies - B12 - Vitamin D (25 hydroxy) - CK (Creatine Kinase)  3. Body aches - ANA,IFA RA Diag Pnl w/rflx Tit/Patn - CBC w/Diff/Platelet - Basic Metabolic Panel (BMET) - TSH - B. burgdorfi antibodies - B12 - Vitamin D (25 hydroxy)  4. Tick bite, initial encounter 2 tick bites 2 days ago.  - ANA,IFA RA Diag Pnl w/rflx Tit/Patn - CBC w/Diff/Platelet - Basic Metabolic Panel (BMET) - TSH - B. burgdorfi antibodies - B12 - Vitamin D (25 hydroxy)  5. Adult hypothyroidism Will check labs as below and f/u pending results. Had been stable on levothyroxine .  - ANA,IFA RA Diag Pnl w/rflx Tit/Patn - CBC w/Diff/Platelet - Basic Metabolic Panel (BMET) - TSH - B. burgdorfi antibodies - B12 - Vitamin D (25 hydroxy)  6. Avitaminosis D H/O this.  - ANA,IFA RA Diag Pnl w/rflx Tit/Patn - CBC w/Diff/Platelet - Basic Metabolic Panel (BMET) - TSH - B. burgdorfi antibodies - B12 - Vitamin D (25 hydroxy)       Margaretann Loveless, PA-C  [facility]

## 2018-05-15 ENCOUNTER — Telehealth: Payer: Self-pay

## 2018-05-15 LAB — BASIC METABOLIC PANEL
BUN/Creatinine Ratio: 12 (ref 12–28)
BUN: 12 mg/dL (ref 8–27)
CALCIUM: 9.9 mg/dL (ref 8.7–10.3)
CO2: 26 mmol/L (ref 20–29)
CREATININE: 1.01 mg/dL — AB (ref 0.57–1.00)
Chloride: 105 mmol/L (ref 96–106)
GFR calc Af Amer: 69 mL/min/{1.73_m2} (ref >59)
GFR calc non Af Amer: 60 mL/min/{1.73_m2} (ref >59)
Glucose: 75 mg/dL (ref 65–99)
Potassium: 4.8 mmol/L (ref 3.5–5.2)
SODIUM: 146 mmol/L — AB (ref 134–144)

## 2018-05-15 LAB — CBC WITH DIFFERENTIAL/PLATELET
BASOS ABS: 0 10*3/uL (ref 0.0–0.2)
Basos: 1 %
EOS (ABSOLUTE): 0.2 10*3/uL (ref 0.0–0.4)
Eos: 4 %
HEMOGLOBIN: 13.5 g/dL (ref 11.1–15.9)
Hematocrit: 39.4 % (ref 34.0–46.6)
IMMATURE GRANS (ABS): 0 10*3/uL (ref 0.0–0.1)
IMMATURE GRANULOCYTES: 0 %
LYMPHS: 33 %
Lymphocytes Absolute: 1.9 10*3/uL (ref 0.7–3.1)
MCH: 27.7 pg (ref 26.6–33.0)
MCHC: 34.3 g/dL (ref 31.5–35.7)
MCV: 81 fL (ref 79–97)
Monocytes Absolute: 0.5 10*3/uL (ref 0.1–0.9)
Monocytes: 9 %
Neutrophils Absolute: 3.1 10*3/uL (ref 1.4–7.0)
Neutrophils: 53 %
Platelets: 228 10*3/uL (ref 150–450)
RBC: 4.87 x10E6/uL (ref 3.77–5.28)
RDW: 14.1 % (ref 12.3–15.4)
WBC: 5.7 10*3/uL (ref 3.4–10.8)

## 2018-05-15 LAB — TSH: TSH: 1.17 u[IU]/mL (ref 0.450–4.500)

## 2018-05-15 LAB — ANA,IFA RA DIAG PNL W/RFLX TIT/PATN
ANA Titer 1: NEGATIVE
CYCLIC CITRULLIN PEPTIDE AB: 4 U (ref 0–19)
Rhuematoid fact SerPl-aCnc: <10 IU/mL (ref 0.0–13.9)

## 2018-05-15 LAB — B. BURGDORFI ANTIBODIES

## 2018-05-15 LAB — VITAMIN B12: Vitamin B-12: 477 pg/mL (ref 232–1245)

## 2018-05-15 LAB — CK: Total CK: 101 U/L (ref 24–173)

## 2018-05-15 LAB — VITAMIN D 25 HYDROXY (VIT D DEFICIENCY, FRACTURES): VIT D 25 HYDROXY: 29.8 ng/mL — AB (ref 30.0–100.0)

## 2018-05-15 NOTE — Telephone Encounter (Signed)
Patient advised as below.  

## 2018-05-15 NOTE — Telephone Encounter (Signed)
-----   Message from Margaretann LovelessJennifer M Burnette, PA-C sent at 05/15/2018  3:49 PM EDT ----- Autoimmune titers were negative. Blood count is normal. Sodium is borderline high so limit salt in diet. Kidney function stable. Thyroid normal. B12 normal Vit D just borderline low. Recommend taking Vit D supplement of 1000 IU daily. CK normal (inflammatory marker). Lyme titer still pending. May consider stopping atorvastatin to see if that medication may be causing the symptoms while we await Lyme titer.

## 2018-09-29 ENCOUNTER — Other Ambulatory Visit: Payer: Self-pay | Admitting: Physician Assistant

## 2018-09-29 DIAGNOSIS — Z1231 Encounter for screening mammogram for malignant neoplasm of breast: Secondary | ICD-10-CM

## 2018-10-02 ENCOUNTER — Ambulatory Visit
Admission: RE | Admit: 2018-10-02 | Discharge: 2018-10-02 | Disposition: A | Discharge status: Home / Self Care | Payer: BC Managed Care – PPO | Source: Ambulatory Visit | Attending: Physician Assistant | Admitting: Physician Assistant

## 2018-10-02 DIAGNOSIS — Z1231 Encounter for screening mammogram for malignant neoplasm of breast: Secondary | ICD-10-CM

## 2018-10-26 ENCOUNTER — Ambulatory Visit (INDEPENDENT_AMBULATORY_CARE_PROVIDER_SITE_OTHER): Payer: BC Managed Care – PPO | Admitting: Physician Assistant

## 2018-10-26 ENCOUNTER — Encounter: Payer: Self-pay | Admitting: Physician Assistant

## 2018-10-26 VITALS — BP 130/82 | HR 73 | Temp 97.6°F | Resp 16 | Wt 134.8 lb

## 2018-10-26 DIAGNOSIS — E559 Vitamin D deficiency, unspecified: Secondary | ICD-10-CM

## 2018-10-26 DIAGNOSIS — F5101 Primary insomnia: Secondary | ICD-10-CM

## 2018-10-26 DIAGNOSIS — E785 Hyperlipidemia, unspecified: Secondary | ICD-10-CM | POA: Diagnosis not present

## 2018-10-26 DIAGNOSIS — E039 Hypothyroidism, unspecified: Secondary | ICD-10-CM | POA: Diagnosis not present

## 2018-10-26 DIAGNOSIS — F3341 Major depressive disorder, recurrent, in partial remission: Secondary | ICD-10-CM | POA: Insufficient documentation

## 2018-10-26 DIAGNOSIS — F3342 Major depressive disorder, recurrent, in full remission: Secondary | ICD-10-CM

## 2018-10-26 DIAGNOSIS — Z0000 Encounter for general adult medical examination without abnormal findings: Secondary | ICD-10-CM | POA: Diagnosis not present

## 2018-10-26 MED ORDER — LEVOTHYROXINE SODIUM 75 MCG PO TABS: 75.0000 ug | ORAL_TABLET | Freq: Every day | ORAL | 3 refills | Status: DC

## 2018-10-26 MED ORDER — FLUOXETINE HCL 20 MG PO CAPS: 20.0000 mg | ORAL_CAPSULE | Freq: Every day | ORAL | 3 refills | Status: DC

## 2018-10-26 MED ORDER — ZOLPIDEM TARTRATE 10 MG PO TABS: 5.0000 mg | ORAL_TABLET | Freq: Every evening | ORAL | 1 refills | Status: DC | PRN

## 2018-10-26 MED ORDER — ATORVASTATIN CALCIUM 10 MG PO TABS: 10.0000 mg | ORAL_TABLET | Freq: Every day | ORAL | 3 refills | Status: DC

## 2018-10-26 NOTE — Progress Notes (Signed)
Patient: Brittany Reese, Female    DOB: 17-May-1956, 62 y.o.   MRN: [id number] Visit Date: 10/26/2018  Today's Provider: Margaretann Loveless, PA-C   Chief Complaint  Patient presents with  . Annual Exam   Subjective:    Annual physical exam Brittany Reese is a 62 y.o. female who presents today for health maintenance and complete physical. She feels well. She reports exercising includes walking and lifting. She reports she is sleeping fairly well with the help of Ambien.  10/23/17 CPE 10/15/16 Pap/HPV-negative 10/02/18 Mammogram-BI-RADS 1 01/16/17 Diverticulosis-recheck in 5 yrs -----------------------------------------------------------------   Review of Systems  Constitutional: Positive for activity change.  HENT: Negative.   Eyes: Negative.   Respiratory: Negative.   Cardiovascular: Negative.   Gastrointestinal: Negative.   Endocrine: Negative.   Genitourinary: Negative.   Musculoskeletal: Negative.   Skin: Negative.   Allergic/Immunologic: Negative.   Neurological: Negative.   Hematological: Negative.   Psychiatric/Behavioral: Negative.     Social History      She  reports that she quit smoking about 4 years ago. Her smoking use included cigarettes. She has never used smokeless tobacco. She reports that she drinks about 3.0 standard drinks of alcohol per week. She reports that she does not use drugs.       Social History   Socioeconomic History  . Marital status: Married    Spouse name: Not on file  . Number of children: Not on file  . Years of education: Not on file  . Highest education level: Not on file  Occupational History  . Not on file  Social Needs  . Financial resource strain: Not on file  . Food insecurity:    Worry: Not on file    Inability: Not on file  . Transportation needs:    Medical: Not on file    Non-medical: Not on file  Tobacco Use  . Smoking status: Former Smoker    Types: Cigarettes    Last attempt to quit:  10/18/2014    Years since quitting: 4.0  . Smokeless tobacco: Never Used  Substance and Sexual Activity  . Alcohol use: Yes    Alcohol/week: 3.0 standard drinks    Types: 3 Cans of beer per week    Comment: occasional  . Drug use: No  . Sexual activity: Never    Birth control/protection: None  Lifestyle  . Physical activity:    Days per week: Not on file    Minutes per session: Not on file  . Stress: Not on file  Relationships  . Social connections:    Talks on phone: Not on file    Gets together: Not on file    Attends religious service: Not on file    Active member of club or organization: Not on file    Attends meetings of clubs or organizations: Not on file    Relationship status: Not on file  Other Topics Concern  . Not on file  Social History Narrative  . Not on file    Past Medical History:  Diagnosis Date  . Depression   . Hyperlipidemia   . Thyroid disease      Patient Active Problem List   Diagnosis Date Noted  . Lichen sclerosus et atrophicus 08/07/2017  . Cold sore 08/07/2017  . Arthropathia 10/05/2015  . Blood in the urine 10/05/2015  . Insomnia 10/05/2015  . Restless leg 10/05/2015  . Avitaminosis D 08/11/2009  . Addison's keloid 08/07/2009  .  Clinical depression 05/17/2009  . HLD (hyperlipidemia) 05/17/2009  . Adult hypothyroidism 05/17/2009  . Osteopenia 05/17/2009  . History of colon polyps 12/12/2006  . History of tobacco use 11/25/1988    No past surgical history on file.  Family History        Family Status  Relation Name Status  . Mother  Deceased  . Father  Deceased  . Brother  Alive  . Brother  Alive  . Neg Hx  (Not Specified)        Her family history includes COPD in her mother; Healthy in her brother and brother; Heart disease in her mother; Hypertension in her mother; Kidney disease in her father. There is no history of Breast cancer.      No Known Allergies   Current Outpatient Medications:  .  atorvastatin  (LIPITOR) 10 MG tablet, Take 1 tablet (10 mg total) by mouth at bedtime., Disp: 90 tablet, Rfl: 3 .  FLUoxetine (PROZAC) 20 MG capsule, Take 20 mg by mouth daily., Disp: , Rfl:  .  levothyroxine (SYNTHROID, LEVOTHROID) 75 MCG tablet, Take 1 tablet (75 mcg total) by mouth daily before breakfast., Disp: 90 tablet, Rfl: 3 .  zolpidem (AMBIEN) 10 MG tablet, TAKE 1/2 TO 1 (ONE-HALF TO ONE) TABLET BY MOUTH AT NIGHT AS NEEDED FOR SLEEP, Disp: 30 tablet, Rfl: 5 .  clobetasol ointment (TEMOVATE) 0.05 %, Apply 1 application topically at bedtime., Disp: 45 g, Rfl: 0   Patient Care Team: Margaretann LovelessBurnette, Jennifer M, PA-C as PCP - General (Family Medicine)      Objective:   Vitals: BP 130/82 (BP Location: Left Arm, Patient Position: Sitting, Cuff Size: Normal)   Pulse 73   Temp 97.6 F (36.4 C) (Oral)   Resp 16   Wt 134 lb 12.8 oz (61.1 kg)   SpO2 99%   BMI 24.66 kg/m    Vitals:   10/26/18 1355  BP: 130/82  Pulse: 73  Resp: 16  Temp: 97.6 F (36.4 C)  TempSrc: Oral  SpO2: 99%  Weight: 134 lb 12.8 oz (61.1 kg)     Physical Exam  Constitutional: She is oriented to person, place, and time. She appears well-developed and well-nourished. No distress.  HENT:  Head: Normocephalic and atraumatic.  Right Ear: Hearing, tympanic membrane, external ear and ear canal normal.  Left Ear: Hearing, tympanic membrane, external ear and ear canal normal.  Nose: Nose normal.  Mouth/Throat: Uvula is midline, oropharynx is clear and moist and mucous membranes are normal. No oropharyngeal exudate.  Eyes: Pupils are equal, round, and reactive to light. Conjunctivae and EOM are normal. Right eye exhibits no discharge. Left eye exhibits no discharge. No scleral icterus.  Neck: Normal range of motion. Neck supple. No JVD present. Carotid bruit is not present. No tracheal deviation present. No thyromegaly present.  Cardiovascular: Normal rate, regular rhythm, normal heart sounds and intact distal pulses. Exam reveals no  gallop and no friction rub.  No murmur heard. Pulmonary/Chest: Effort normal and breath sounds normal. No respiratory distress. She has no wheezes. She has no rales. She exhibits no tenderness.  Abdominal: Soft. Bowel sounds are normal. She exhibits no distension and no mass. There is no tenderness. There is no rebound and no guarding.  Musculoskeletal: Normal range of motion. She exhibits no edema or tenderness.  Lymphadenopathy:    She has no cervical adenopathy.  Neurological: She is alert and oriented to person, place, and time.  Skin: Skin is warm and dry. No rash noted.  She is not diaphoretic.  Psychiatric: She has a normal mood and affect. Her behavior is normal. Judgment and thought content normal.  Vitals reviewed.    Depression Screen PHQ 2/9 Scores 10/26/2018 10/23/2017 10/06/2015  PHQ - 2 Score 2 - 0  PHQ- 9 Score 5 - -  Exception Documentation - Patient refusal -      Assessment & Plan:     Routine Health Maintenance and Physical Exam  Exercise Activities and Dietary recommendations Goals    . Exercise 150 minutes per week (moderate activity)       Immunization History  Administered Date(s) Administered  . Influenza,inj,Quad PF,6+ Mos 10/06/2015  . Influenza-Unspecified 07/21/2017  . Tdap 04/29/2005, 07/21/2017    Health Maintenance  Topic Date Due  . INFLUENZA VACCINE  06/25/2018  . MAMMOGRAM  10/03/2019  . PAP SMEAR  10/16/2019  . COLONOSCOPY  01/16/2022  . TETANUS/TDAP  07/22/2027  . Hepatitis C Screening  Completed  . HIV Screening  Completed     Discussed health benefits of physical activity, and encouraged her to engage in regular exercise appropriate for her age and condition.    1. Annual physical exam Normal physical exam today. Will check labs as below and f/u pending lab results. If labs are stable and WNL she will not need to have these rechecked for one year at her next annual physical exam. She is to call the office in the meantime if  she has any acute issue, questions or concerns. - CBC with Differential/Platelet - Comprehensive metabolic panel - HgB A1c  2. Hyperlipidemia, unspecified hyperlipidemia type Stable. Diagnosis pulled for medication refill. Continue current medical treatment plan. Will check labs as below and f/u pending results. - Lipid Panel With LDL/HDL Ratio - Comprehensive metabolic panel - atorvastatin (LIPITOR) 10 MG tablet; Take 1 tablet (10 mg total) by mouth at bedtime.  Dispense: 90 tablet; Refill: 3  3. Adult hypothyroidism Stable. Diagnosis pulled for medication refill. Continue current medical treatment plan. Will check labs as below and f/u pending results. - TSH - levothyroxine (SYNTHROID, LEVOTHROID) 75 MCG tablet; Take 1 tablet (75 mcg total) by mouth daily before breakfast.  Dispense: 90 tablet; Refill: 3  4. Avitaminosis D Doing better on Vit D 1000 IU daily. Will check labs as below and f/u pending results. - CBC with Differential/Platelet - VITAMIN D 25 Hydroxy (Vit-D Deficiency, Fractures)  5. Recurrent major depressive disorder, in full remission (HCC) Stable. Diagnosis pulled for medication refill. Continue current medical treatment plan. - FLUoxetine (PROZAC) 20 MG capsule; Take 1 capsule (20 mg total) by mouth daily.  Dispense: 90 capsule; Refill: 3  6. Primary insomnia Stable. Diagnosis pulled for medication refill. Continue current medical treatment plan. - zolpidem (AMBIEN) 10 MG tablet; Take 0.5-1 tablets (5-10 mg total) by mouth at bedtime as needed for sleep.  Dispense: 90 tablet; Refill: 1  --------------------------------------------------------------------    Margaretann Loveless, PA-C  [address] [facility]

## 2018-10-26 NOTE — Patient Instructions (Signed)
Health Maintenance for Postmenopausal Women Menopause is a normal process in which your reproductive ability comes to an end. This process happens gradually over a span of months to years, usually between the ages of 22 and 9. Menopause is complete when you have missed 12 consecutive menstrual periods. It is important to talk with your health care provider about some of the most common conditions that affect postmenopausal women, such as heart disease, cancer, and bone loss (osteoporosis). Adopting a healthy lifestyle and getting preventive care can help to promote your health and wellness. Those actions can also lower your chances of developing some of these common conditions. What should I know about menopause? During menopause, you may experience a number of symptoms, such as:  Moderate-to-severe hot flashes.  Night sweats.  Decrease in sex drive.  Mood swings.  Headaches.  Tiredness.  Irritability.  Memory problems.  Insomnia.  Choosing to treat or not to treat menopausal changes is an individual decision that you make with your health care provider. What should I know about hormone replacement therapy and supplements? Hormone therapy products are effective for treating symptoms that are associated with menopause, such as hot flashes and night sweats. Hormone replacement carries certain risks, especially as you become older. If you are thinking about using estrogen or estrogen with progestin treatments, discuss the benefits and risks with your health care provider. What should I know about heart disease and stroke? Heart disease, heart attack, and stroke become more likely as you age. This may be due, in part, to the hormonal changes that your body experiences during menopause. These can affect how your body processes dietary fats, triglycerides, and cholesterol. Heart attack and stroke are both medical emergencies. There are many things that you can do to help prevent heart disease  and stroke:  Have your blood pressure checked at least every 1-2 years. High blood pressure causes heart disease and increases the risk of stroke.  If you are 53-22 years old, ask your health care provider if you should take aspirin to prevent a heart attack or a stroke.  Do not use any tobacco products, including cigarettes, chewing tobacco, or electronic cigarettes. If you need help quitting, ask your health care provider.  It is important to eat a healthy diet and maintain a healthy weight. ? Be sure to include plenty of vegetables, fruits, low-fat dairy products, and lean protein. ? Avoid eating foods that are high in solid fats, added sugars, or salt (sodium).  Get regular exercise. This is one of the most important things that you can do for your health. ? Try to exercise for at least 150 minutes each week. The type of exercise that you do should increase your heart rate and make you sweat. This is known as moderate-intensity exercise. ? Try to do strengthening exercises at least twice each week. Do these in addition to the moderate-intensity exercise.  Know your numbers.Ask your health care provider to check your cholesterol and your blood glucose. Continue to have your blood tested as directed by your health care provider.  What should I know about cancer screening? There are several types of cancer. Take the following steps to reduce your risk and to catch any cancer development as early as possible. Breast Cancer  Practice breast self-awareness. ? This means understanding how your breasts normally appear and feel. ? It also means doing regular breast self-exams. Let your health care provider know about any changes, no matter how small.  If you are 40  or older, have a clinician do a breast exam (clinical breast exam or CBE) every year. Depending on your age, family history, and medical history, it may be recommended that you also have a yearly breast X-ray (mammogram).  If you  have a family history of breast cancer, talk with your health care provider about genetic screening.  If you are at high risk for breast cancer, talk with your health care provider about having an MRI and a mammogram every year.  Breast cancer (BRCA) gene test is recommended for women who have family members with BRCA-related cancers. Results of the assessment will determine the need for genetic counseling and BRCA1 and for BRCA2 testing. BRCA-related cancers include these types: ? Breast. This occurs in males or females. ? Ovarian. ? Tubal. This may also be called fallopian tube cancer. ? Cancer of the abdominal or pelvic lining (peritoneal cancer). ? Prostate. ? Pancreatic.  Cervical, Uterine, and Ovarian Cancer Your health care provider may recommend that you be screened regularly for cancer of the pelvic organs. These include your ovaries, uterus, and vagina. This screening involves a pelvic exam, which includes checking for microscopic changes to the surface of your cervix (Pap test).  For women ages 21-65, health care providers may recommend a pelvic exam and a Pap test every three years. For women ages 79-65, they may recommend the Pap test and pelvic exam, combined with testing for human papilloma virus (HPV), every five years. Some types of HPV increase your risk of cervical cancer. Testing for HPV may also be done on women of any age who have unclear Pap test results.  Other health care providers may not recommend any screening for nonpregnant women who are considered low risk for pelvic cancer and have no symptoms. Ask your health care provider if a screening pelvic exam is right for you.  If you have had past treatment for cervical cancer or a condition that could lead to cancer, you need Pap tests and screening for cancer for at least 20 years after your treatment. If Pap tests have been discontinued for you, your risk factors (such as having a new sexual partner) need to be  reassessed to determine if you should start having screenings again. Some women have medical problems that increase the chance of getting cervical cancer. In these cases, your health care provider may recommend that you have screening and Pap tests more often.  If you have a family history of uterine cancer or ovarian cancer, talk with your health care provider about genetic screening.  If you have vaginal bleeding after reaching menopause, tell your health care provider.  There are currently no reliable tests available to screen for ovarian cancer.  Lung Cancer Lung cancer screening is recommended for adults 69-62 years old who are at high risk for lung cancer because of a history of smoking. A yearly low-dose CT scan of the lungs is recommended if you:  Currently smoke.  Have a history of at least 30 pack-years of smoking and you currently smoke or have quit within the past 15 years. A pack-year is smoking an average of one pack of cigarettes per day for one year.  Yearly screening should:  Continue until it has been 15 years since you quit.  Stop if you develop a health problem that would prevent you from having lung cancer treatment.  Colorectal Cancer  This type of cancer can be detected and can often be prevented.  Routine colorectal cancer screening usually begins at  age 42 and continues through age 45.  If you have risk factors for colon cancer, your health care provider may recommend that you be screened at an earlier age.  If you have a family history of colorectal cancer, talk with your health care provider about genetic screening.  Your health care provider may also recommend using home test kits to check for hidden blood in your stool.  A small camera at the end of a tube can be used to examine your colon directly (sigmoidoscopy or colonoscopy). This is done to check for the earliest forms of colorectal cancer.  Direct examination of the colon should be repeated every  5-10 years until age 71. However, if early forms of precancerous polyps or small growths are found or if you have a family history or genetic risk for colorectal cancer, you may need to be screened more often.  Skin Cancer  Check your skin from head to toe regularly.  Monitor any moles. Be sure to tell your health care provider: ? About any new moles or changes in moles, especially if there is a change in a mole's shape or color. ? If you have a mole that is larger than the size of a pencil eraser.  If any of your family members has a history of skin cancer, especially at a young age, talk with your health care provider about genetic screening.  Always use sunscreen. Apply sunscreen liberally and repeatedly throughout the day.  Whenever you are outside, protect yourself by wearing long sleeves, pants, a wide-brimmed hat, and sunglasses.  What should I know about osteoporosis? Osteoporosis is a condition in which bone destruction happens more quickly than new bone creation. After menopause, you may be at an increased risk for osteoporosis. To help prevent osteoporosis or the bone fractures that can happen because of osteoporosis, the following is recommended:  If you are 46-71 years old, get at least 1,000 mg of calcium and at least 600 mg of vitamin D per day.  If you are older than age 55 but younger than age 65, get at least 1,200 mg of calcium and at least 600 mg of vitamin D per day.  If you are older than age 54, get at least 1,200 mg of calcium and at least 800 mg of vitamin D per day.  Smoking and excessive alcohol intake increase the risk of osteoporosis. Eat foods that are rich in calcium and vitamin D, and do weight-bearing exercises several times each week as directed by your health care provider. What should I know about how menopause affects my mental health? Depression may occur at any age, but it is more common as you become older. Common symptoms of depression  include:  Low or sad mood.  Changes in sleep patterns.  Changes in appetite or eating patterns.  Feeling an overall lack of motivation or enjoyment of activities that you previously enjoyed.  Frequent crying spells.  Talk with your health care provider if you think that you are experiencing depression. What should I know about immunizations? It is important that you get and maintain your immunizations. These include:  Tetanus, diphtheria, and pertussis (Tdap) booster vaccine.  Influenza every year before the flu season begins.  Pneumonia vaccine.  Shingles vaccine.  Your health care provider may also recommend other immunizations. This information is not intended to replace advice given to you by your health care provider. Make sure you discuss any questions you have with your health care provider. Document Released: 01/03/2006  Document Revised: 05/31/2016 Document Reviewed: 08/15/2015 Elsevier Interactive Patient Education  2018 [employer]  

## 2018-10-27 LAB — CBC WITH DIFFERENTIAL/PLATELET
BASOS ABS: 0 10*3/uL (ref 0.0–0.2)
Basos: 1 %
EOS (ABSOLUTE): 0.1 10*3/uL (ref 0.0–0.4)
Eos: 2 %
Hematocrit: 40.5 % (ref 34.0–46.6)
Hemoglobin: 13.4 g/dL (ref 11.1–15.9)
IMMATURE GRANS (ABS): 0 10*3/uL (ref 0.0–0.1)
IMMATURE GRANULOCYTES: 0 %
LYMPHS: 36 %
Lymphocytes Absolute: 2.2 10*3/uL (ref 0.7–3.1)
MCH: 28.2 pg (ref 26.6–33.0)
MCHC: 33.1 g/dL (ref 31.5–35.7)
MCV: 85 fL (ref 79–97)
MONOCYTES: 8 %
Monocytes Absolute: 0.5 10*3/uL (ref 0.1–0.9)
NEUTROS ABS: 3.2 10*3/uL (ref 1.4–7.0)
Neutrophils: 53 %
Platelets: 234 10*3/uL (ref 150–450)
RBC: 4.75 x10E6/uL (ref 3.77–5.28)
RDW: 13.2 % (ref 12.3–15.4)
WBC: 6.1 10*3/uL (ref 3.4–10.8)

## 2018-10-27 LAB — COMPREHENSIVE METABOLIC PANEL
ALBUMIN: 4.7 g/dL (ref 3.6–4.8)
ALT: 15 IU/L (ref 0–32)
AST: 21 IU/L (ref 0–40)
Albumin/Globulin Ratio: 2.5 — ABNORMAL HIGH (ref 1.2–2.2)
Alkaline Phosphatase: 100 IU/L (ref 39–117)
BILIRUBIN TOTAL: 0.5 mg/dL (ref 0.0–1.2)
BUN / CREAT RATIO: 12 (ref 12–28)
BUN: 12 mg/dL (ref 8–27)
CALCIUM: 9.5 mg/dL (ref 8.7–10.3)
CHLORIDE: 101 mmol/L (ref 96–106)
CO2: 23 mmol/L (ref 20–29)
CREATININE: 1.04 mg/dL — AB (ref 0.57–1.00)
GFR, EST AFRICAN AMERICAN: 66 mL/min/{1.73_m2} (ref >59)
GFR, EST NON AFRICAN AMERICAN: 57 mL/min/{1.73_m2} — AB (ref >59)
GLUCOSE: 83 mg/dL (ref 65–99)
Globulin, Total: 1.9 g/dL (ref 1.5–4.5)
Potassium: 4.2 mmol/L (ref 3.5–5.2)
Sodium: 139 mmol/L (ref 134–144)
TOTAL PROTEIN: 6.6 g/dL (ref 6.0–8.5)

## 2018-10-27 LAB — HEMOGLOBIN A1C
Est. average glucose Bld gHb Est-mCnc: 117 mg/dL
Hgb A1c MFr Bld: 5.7 % — ABNORMAL HIGH (ref 4.8–5.6)

## 2018-10-27 LAB — LIPID PANEL WITH LDL/HDL RATIO
CHOLESTEROL TOTAL: 219 mg/dL — AB (ref 100–199)
HDL: 57 mg/dL (ref >39)
LDL Calculated: 133 mg/dL — ABNORMAL HIGH (ref 0–99)
LDl/HDL Ratio: 2.3 ratio (ref 0.0–3.2)
Triglycerides: 145 mg/dL (ref 0–149)
VLDL Cholesterol Cal: 29 mg/dL (ref 5–40)

## 2018-10-27 LAB — TSH: TSH: 1.73 u[IU]/mL (ref 0.450–4.500)

## 2018-10-27 LAB — VITAMIN D 25 HYDROXY (VIT D DEFICIENCY, FRACTURES): Vit D, 25-Hydroxy: 22.9 ng/mL — ABNORMAL LOW (ref 30.0–100.0)

## 2018-10-28 ENCOUNTER — Telehealth: Payer: Self-pay

## 2018-10-28 NOTE — Telephone Encounter (Signed)
Patient was advised.  

## 2018-10-28 NOTE — Telephone Encounter (Signed)
-----   Message from Margaretann LovelessJennifer M Burnette, PA-C sent at 10/28/2018  9:35 AM EST ----- Cholesterol increased slightly from last year. Still not elevated enough to need cholesterol lowering medication at this time. Blood count is normal. Kidney function and liver function normal. Thyroid normal. Sugar normal. Vit D is low at 22.9. Recommend OTC Vit D of 1000-2000 IU daily.

## 2019-03-25 ENCOUNTER — Encounter: Payer: Self-pay | Admitting: Physician Assistant

## 2019-03-25 DIAGNOSIS — F5101 Primary insomnia: Secondary | ICD-10-CM

## 2019-03-26 MED ORDER — ZOLPIDEM TARTRATE 10 MG PO TABS: 5.0000 mg | ORAL_TABLET | Freq: Every evening | ORAL | 1 refills | Status: DC | PRN

## 2019-10-29 ENCOUNTER — Encounter: Payer: Self-pay | Admitting: Physician Assistant

## 2019-10-29 ENCOUNTER — Ambulatory Visit (INDEPENDENT_AMBULATORY_CARE_PROVIDER_SITE_OTHER): Payer: BC Managed Care – PPO | Admitting: Physician Assistant

## 2019-10-29 ENCOUNTER — Other Ambulatory Visit: Payer: Self-pay

## 2019-10-29 ENCOUNTER — Telehealth: Payer: Self-pay | Admitting: Physician Assistant

## 2019-10-29 ENCOUNTER — Other Ambulatory Visit (HOSPITAL_COMMUNITY)
Admission: RE | Admit: 2019-10-29 | Discharge: 2019-10-29 | Disposition: A | Discharge status: Home / Self Care | Payer: BC Managed Care – PPO | Source: Ambulatory Visit | Attending: Physician Assistant | Admitting: Physician Assistant

## 2019-10-29 VITALS — BP 132/76 | HR 81 | Temp 96.8°F | Resp 16 | Ht 62.0 in | Wt 138.0 lb

## 2019-10-29 DIAGNOSIS — Z1239 Encounter for other screening for malignant neoplasm of breast: Secondary | ICD-10-CM

## 2019-10-29 DIAGNOSIS — F3342 Major depressive disorder, recurrent, in full remission: Secondary | ICD-10-CM

## 2019-10-29 DIAGNOSIS — E559 Vitamin D deficiency, unspecified: Secondary | ICD-10-CM

## 2019-10-29 DIAGNOSIS — Z124 Encounter for screening for malignant neoplasm of cervix: Secondary | ICD-10-CM | POA: Insufficient documentation

## 2019-10-29 DIAGNOSIS — Z0000 Encounter for general adult medical examination without abnormal findings: Secondary | ICD-10-CM

## 2019-10-29 DIAGNOSIS — E7800 Pure hypercholesterolemia, unspecified: Secondary | ICD-10-CM

## 2019-10-29 DIAGNOSIS — E039 Hypothyroidism, unspecified: Secondary | ICD-10-CM | POA: Diagnosis not present

## 2019-10-29 DIAGNOSIS — L237 Allergic contact dermatitis due to plants, except food: Secondary | ICD-10-CM

## 2019-10-29 MED ORDER — FLUOCINONIDE-E 0.05 % EX CREA: 1.0000 "application " | TOPICAL_CREAM | Freq: Two times a day (BID) | CUTANEOUS | 1 refills | Status: AC

## 2019-10-29 NOTE — Progress Notes (Signed)
Patient: Brittany Reese, Female    DOB: 02/01/1956, 63 y.o.   MRN: [id number] Visit Date: 10/29/2019  Today's Provider: Margaretann Loveless, PA-C   Chief Complaint  Patient presents with  . Annual Exam   Subjective:     Annual physical exam Brittany Reese is a 63 y.o. female who presents today for health maintenance and complete physical. She feels fairly well. She reports no regular exercising. She reports she is sleeping fairly well.  ----------------------------------------------------------------- 10/26/2018 CPE 10/15/16 Pap/HPV-negative 10/02/18 Mammogram-BI-RADS 1 01/16/17 Diverticulosis-recheck in 5 yrs  Review of Systems  Constitutional: Positive for appetite change and unexpected weight change. Negative for chills, fatigue and fever.  HENT: Negative for congestion, ear pain, rhinorrhea, sneezing and sore throat.   Eyes: Negative.  Negative for pain and redness.  Respiratory: Negative for cough, shortness of breath and wheezing.   Cardiovascular: Positive for palpitations. Negative for chest pain and leg swelling.  Gastrointestinal: Negative for abdominal pain, blood in stool, constipation, diarrhea and nausea.  Endocrine: Negative for polydipsia and polyphagia.  Genitourinary: Negative.  Negative for dysuria, flank pain, hematuria, pelvic pain, vaginal bleeding and vaginal discharge.  Musculoskeletal: Negative for arthralgias, back pain, gait problem and joint swelling.  Skin: Negative for rash.  Neurological: Negative.  Negative for dizziness, tremors, seizures, weakness, light-headedness, numbness and headaches.  Hematological: Negative for adenopathy.  Psychiatric/Behavioral: Negative for behavioral problems, confusion and dysphoric mood. The patient is nervous/anxious. The patient is not hyperactive.     Social History      She  reports that she quit smoking about 5 years ago. Her smoking use included cigarettes. She has never used smokeless tobacco.  She reports current alcohol use of about 3.0 standard drinks of alcohol per week. She reports that she does not use drugs.       Social History   Socioeconomic History  . Marital status: Married    Spouse name: Not on file  . Number of children: Not on file  . Years of education: Not on file  . Highest education level: Not on file  Occupational History  . Not on file  Social Needs  . Financial resource strain: Not on file  . Food insecurity    Worry: Not on file    Inability: Not on file  . Transportation needs    Medical: Not on file    Non-medical: Not on file  Tobacco Use  . Smoking status: Former Smoker    Types: Cigarettes    Quit date: 10/18/2014    Years since quitting: 5.0  . Smokeless tobacco: Never Used  Substance and Sexual Activity  . Alcohol use: Yes    Alcohol/week: 3.0 standard drinks    Types: 3 Cans of beer per week    Comment: occasional  . Drug use: No  . Sexual activity: Never    Birth control/protection: None  Lifestyle  . Physical activity    Days per week: Not on file    Minutes per session: Not on file  . Stress: Not on file  Relationships  . Social Musician on phone: Not on file    Gets together: Not on file    Attends religious service: Not on file    Active member of club or organization: Not on file    Attends meetings of clubs or organizations: Not on file    Relationship status: Not on file  Other Topics Concern  . Not on  file  Social History Narrative  . Not on file    Past Medical History:  Diagnosis Date  . Depression   . Hyperlipidemia   . Thyroid disease      Patient Active Problem List   Diagnosis Date Noted  . Recurrent major depressive disorder, in partial remission (HCC) 10/26/2018  . Lichen sclerosus et atrophicus 08/07/2017  . Cold sore 08/07/2017  . Arthropathia 10/05/2015  . Blood in the urine 10/05/2015  . Insomnia 10/05/2015  . Restless leg 10/05/2015  . Avitaminosis D 08/11/2009  .  Addison's keloid 08/07/2009  . Clinical depression 05/17/2009  . HLD (hyperlipidemia) 05/17/2009  . Adult hypothyroidism 05/17/2009  . Osteopenia 05/17/2009  . History of colon polyps 12/12/2006  . History of tobacco use 11/25/1988    No past surgical history on file.  Family History        Family Status  Relation Name Status  . Mother  Deceased  . Father  Deceased  . Brother  Alive  . Brother  Alive  . Neg Hx  (Not Specified)        Her family history includes COPD in her mother; Healthy in her brother and brother; Heart disease in her mother; Hypertension in her mother; Kidney disease in her father. There is no history of Breast cancer.      No Known Allergies   Current Outpatient Medications:  .  atorvastatin (LIPITOR) 10 MG tablet, Take 1 tablet (10 mg total) by mouth at bedtime., Disp: 90 tablet, Rfl: 3 .  clobetasol ointment (TEMOVATE) 0.05 %, Apply 1 application topically at bedtime., Disp: 45 g, Rfl: 0 .  levothyroxine (SYNTHROID, LEVOTHROID) 75 MCG tablet, Take 1 tablet (75 mcg total) by mouth daily before breakfast., Disp: 90 tablet, Rfl: 3 .  zolpidem (AMBIEN) 10 MG tablet, Take 0.5-1 tablets (5-10 mg total) by mouth at bedtime as needed for sleep., Disp: 90 tablet, Rfl: 1 .  FLUoxetine (PROZAC) 20 MG capsule, Take 1 capsule (20 mg total) by mouth daily. (Patient not taking: Reported on 10/29/2019), Disp: 90 capsule, Rfl: 3   Patient Care Team: Margaretann Loveless, PA-C as PCP - General (Family Medicine)    Objective:    Vitals: BP 132/76 (BP Location: Left Arm, Patient Position: Sitting, Cuff Size: Normal)   Pulse 81   Temp (!) 96.8 F (36 C) (Temporal)   Resp 16   Ht  (1.575 m)   Wt 138 lb (62.6 kg)   SpO2 98% Comment: room air  BMI 25.24 kg/m    Vitals:   10/29/19 1000  BP: 132/76  Pulse: 81  Resp: 16  Temp: (!) 96.8 F (36 C)  TempSrc: Temporal  SpO2: 98%  Weight: 138 lb (62.6 kg)  Height:  (1.575 m)     Physical Exam Vitals  signs reviewed. Exam conducted with a chaperone present.  Constitutional:      General: She is not in acute distress.    Appearance: Normal appearance. She is well-developed and normal weight. She is not ill-appearing or diaphoretic.  HENT:     Head: Normocephalic and atraumatic.     Right Ear: Hearing, tympanic membrane, ear canal and external ear normal.     Left Ear: Hearing, tympanic membrane, ear canal and external ear normal.     Nose: Nose normal.     Mouth/Throat:     Pharynx: Uvula midline.  Eyes:     General: No scleral icterus.       Right  eye: No discharge.        Left eye: No discharge.     Conjunctiva/sclera: Conjunctivae normal.     Pupils: Pupils are equal, round, and reactive to light.  Neck:     Musculoskeletal: Normal range of motion and neck supple.     Thyroid: No thyromegaly.     Vascular: No carotid bruit or JVD.     Trachea: No tracheal deviation.  Cardiovascular:     Rate and Rhythm: Normal rate and regular rhythm.     Pulses: Normal pulses.     Heart sounds: Normal heart sounds. No murmur. No friction rub. No gallop.   Pulmonary:     Effort: Pulmonary effort is normal. No respiratory distress.     Breath sounds: Normal breath sounds. No wheezing or rales.  Chest:     Chest wall: No tenderness.     Breasts: Breasts are symmetrical.        Right: Normal. No inverted nipple, mass, nipple discharge, skin change or tenderness.        Left: Normal. No inverted nipple, mass, nipple discharge, skin change or tenderness.  Abdominal:     General: Abdomen is flat. Bowel sounds are normal. There is no distension.     Palpations: Abdomen is soft. There is no mass.     Tenderness: There is no abdominal tenderness. There is no guarding or rebound.     Hernia: There is no hernia in the left inguinal area.  Genitourinary:    General: Normal vulva.     Exam position: Supine.     Pubic Area: No rash.      Labia:        Right: No rash, tenderness, lesion or injury.         Left: No rash, tenderness, lesion or injury.      Vagina: Normal. No signs of injury. No vaginal discharge, erythema, tenderness or bleeding.     Cervix: No cervical motion tenderness, discharge or friability.     Uterus: Normal.      Adnexa:        Right: No mass, tenderness or fullness.         Left: No mass, tenderness or fullness.       Rectum: Normal.       Comments: Vaginal atrophy noted Musculoskeletal: Normal range of motion.        General: No tenderness.     Right lower leg: No edema.     Left lower leg: No edema.  Lymphadenopathy:     Cervical: No cervical adenopathy.     Upper Body:     Right upper body: No supraclavicular, axillary or pectoral adenopathy.     Left upper body: No supraclavicular, axillary or pectoral adenopathy.  Skin:    General: Skin is warm and dry.     Capillary Refill: Capillary refill takes less than 2 seconds.     Findings: No rash.  Neurological:     General: No focal deficit present.     Mental Status: She is alert and oriented to person, place, and time. Mental status is at baseline.     Cranial Nerves: No cranial nerve deficit.     Coordination: Coordination normal.     Deep Tendon Reflexes: Reflexes are normal and symmetric.  Psychiatric:        Mood and Affect: Mood normal.        Behavior: Behavior normal.        Thought Content: Thought  content normal.        Judgment: Judgment normal.      Depression Screen PHQ 2/9 Scores 10/29/2019 10/26/2018 10/23/2017 10/06/2015  PHQ - 2 Score 2 2 - 0  PHQ- 9 Score 5 5 - -  Exception Documentation - - Patient refusal -       Assessment & Plan:     Routine Health Maintenance and Physical Exam  Exercise Activities and Dietary recommendations Goals    . Exercise 150 minutes per week (moderate activity)       Immunization History  Administered Date(s) Administered  . Influenza,inj,Quad PF,6+ Mos 10/06/2015  . Influenza-Unspecified 07/21/2017, 09/07/2019  . Tdap 04/29/2005,  07/21/2017    Health Maintenance  Topic Date Due  . MAMMOGRAM  10/03/2019  . PAP SMEAR-Modifier  10/16/2019  . COLONOSCOPY  01/16/2022  . TETANUS/TDAP  07/22/2027  . INFLUENZA VACCINE  Completed  . Hepatitis C Screening  Completed  . HIV Screening  Completed     Discussed health benefits of physical activity, and encouraged her to engage in regular exercise appropriate for her age and condition.    1. Annual physical exam Normal physical exam today. Will check labs as below and f/u pending lab results. If labs are stable and WNL she will not need to have these rechecked for one year at her next annual physical exam. She is to call the office in the meantime if she has any acute issue, questions or concerns. - CBC w/Diff/Platelet - Comprehensive Metabolic Panel (CMET) - TSH - Lipid Profile - HgB A1c  2. Encounter for breast cancer screening using non-mammogram modality Breast exam today was normal. There is no family history of breast cancer. She does perform regular self breast exams. Mammogram was ordered as below. Information for [facility] clinic was given to patient so she may schedule her mammogram at her convenience. - MM 3D SCREEN BREAST BILATERAL; Future  3. Cervical cancer screening Pap collected today. Will send as below and f/u pending results. - Cytology - PAP  4. Adult hypothyroidism Stable. Continue Levothyroxine 69mcg. Will check labs as below and f/u pending results. - CBC w/Diff/Platelet - Comprehensive Metabolic Panel (CMET) - TSH  5. Pure hypercholesterolemia Stable. Continue atorvastatin 10mg . Will check labs as below and f/u pending results. - CBC w/Diff/Platelet - Comprehensive Metabolic Panel (CMET) - Lipid Profile  6. Avitaminosis D H/O this and postmenopausal. Will check labs as below and f/u pending results. - CBC w/Diff/Platelet - Vitamin D (25 hydroxy)  7. Recurrent major depressive disorder, in full remission ([place]) Previously on  Fluoxetine 20mg . Now off medication. Doing well.   --------------------------------------------------------------------    Mar Daring, PA-C  [facility]

## 2019-10-29 NOTE — Telephone Encounter (Signed)
Pt states that her medication list isn't right on her after visit summary and would like to get this corrected.

## 2019-10-29 NOTE — Telephone Encounter (Signed)
Tried calling patient. Left message to call back. 

## 2019-10-29 NOTE — Patient Instructions (Signed)

## 2019-10-30 ENCOUNTER — Encounter: Payer: Self-pay | Admitting: Physician Assistant

## 2019-10-30 DIAGNOSIS — L900 Lichen sclerosus et atrophicus: Secondary | ICD-10-CM

## 2019-10-30 DIAGNOSIS — E785 Hyperlipidemia, unspecified: Secondary | ICD-10-CM

## 2019-10-30 DIAGNOSIS — E039 Hypothyroidism, unspecified: Secondary | ICD-10-CM

## 2019-10-30 LAB — COMPREHENSIVE METABOLIC PANEL
ALT: 14 IU/L (ref 0–32)
AST: 20 IU/L (ref 0–40)
Albumin/Globulin Ratio: 2.1 (ref 1.2–2.2)
Albumin: 4.4 g/dL (ref 3.8–4.8)
Alkaline Phosphatase: 103 IU/L (ref 39–117)
BUN/Creatinine Ratio: 11 — ABNORMAL LOW (ref 12–28)
BUN: 12 mg/dL (ref 8–27)
Bilirubin Total: 0.2 mg/dL (ref 0.0–1.2)
CO2: 24 mmol/L (ref 20–29)
Calcium: 9.7 mg/dL (ref 8.7–10.3)
Chloride: 104 mmol/L (ref 96–106)
Creatinine, Ser: 1.05 mg/dL — ABNORMAL HIGH (ref 0.57–1.00)
GFR calc Af Amer: 65 mL/min/{1.73_m2} (ref >59)
GFR calc non Af Amer: 56 mL/min/{1.73_m2} — ABNORMAL LOW (ref >59)
Globulin, Total: 2.1 g/dL (ref 1.5–4.5)
Glucose: 90 mg/dL (ref 65–99)
Potassium: 4.2 mmol/L (ref 3.5–5.2)
Sodium: 141 mmol/L (ref 134–144)
Total Protein: 6.5 g/dL (ref 6.0–8.5)

## 2019-10-30 LAB — CBC WITH DIFFERENTIAL/PLATELET
Basophils Absolute: 0.1 10*3/uL (ref 0.0–0.2)
Basos: 1 %
EOS (ABSOLUTE): 0.2 10*3/uL (ref 0.0–0.4)
Eos: 4 %
Hematocrit: 37.4 % (ref 34.0–46.6)
Hemoglobin: 12.2 g/dL (ref 11.1–15.9)
Immature Grans (Abs): 0 10*3/uL (ref 0.0–0.1)
Immature Granulocytes: 0 %
Lymphocytes Absolute: 1.6 10*3/uL (ref 0.7–3.1)
Lymphs: 33 %
MCH: 26.2 pg — ABNORMAL LOW (ref 26.6–33.0)
MCHC: 32.6 g/dL (ref 31.5–35.7)
MCV: 80 fL (ref 79–97)
Monocytes Absolute: 0.5 10*3/uL (ref 0.1–0.9)
Monocytes: 11 %
Neutrophils Absolute: 2.5 10*3/uL (ref 1.4–7.0)
Neutrophils: 51 %
Platelets: 256 10*3/uL (ref 150–450)
RBC: 4.66 x10E6/uL (ref 3.77–5.28)
RDW: 13.4 % (ref 11.7–15.4)
WBC: 4.8 10*3/uL (ref 3.4–10.8)

## 2019-10-30 LAB — VITAMIN D 25 HYDROXY (VIT D DEFICIENCY, FRACTURES): Vit D, 25-Hydroxy: 31.4 ng/mL (ref 30.0–100.0)

## 2019-10-30 LAB — LIPID PANEL
Chol/HDL Ratio: 3.9 ratio (ref 0.0–4.4)
Cholesterol, Total: 198 mg/dL (ref 100–199)
HDL: 51 mg/dL (ref >39)
LDL Chol Calc (NIH): 123 mg/dL — ABNORMAL HIGH (ref 0–99)
Triglycerides: 137 mg/dL (ref 0–149)
VLDL Cholesterol Cal: 24 mg/dL (ref 5–40)

## 2019-10-30 LAB — HEMOGLOBIN A1C
Est. average glucose Bld gHb Est-mCnc: 111 mg/dL
Hgb A1c MFr Bld: 5.5 % (ref 4.8–5.6)

## 2019-10-30 LAB — TSH: TSH: 0.644 u[IU]/mL (ref 0.450–4.500)

## 2019-11-01 ENCOUNTER — Telehealth: Payer: Self-pay

## 2019-11-01 MED ORDER — ATORVASTATIN CALCIUM 10 MG PO TABS: 10.0000 mg | ORAL_TABLET | Freq: Every day | ORAL | 3 refills | Status: DC

## 2019-11-01 MED ORDER — CLOBETASOL PROPIONATE 0.05 % EX OINT: 1.0000 "application " | TOPICAL_OINTMENT | Freq: Every day | CUTANEOUS | 0 refills | Status: AC

## 2019-11-01 MED ORDER — LEVOTHYROXINE SODIUM 75 MCG PO TABS: 75.0000 ug | ORAL_TABLET | Freq: Every day | ORAL | 3 refills | Status: DC

## 2019-11-01 NOTE — Telephone Encounter (Signed)
-----   Message from Mar Daring, [place] sent at 11/01/2019 11:04 AM EST ----- Blood count is normal. Kidney function is stable. Liver enzymes are normal. Sodium, potassium and calcium are normal. Thyroid is normal. Cholesterol has improved compared to last year. A1c/sugar has also improved and is back to normal range. Vit D is normal.

## 2019-11-01 NOTE — Telephone Encounter (Signed)
Viewed by Griffin Basil on 11/01/2019 11:43 AM

## 2019-11-02 ENCOUNTER — Telehealth: Payer: Self-pay

## 2019-11-02 LAB — CYTOLOGY - PAP
Comment: NEGATIVE
Diagnosis: NEGATIVE
High risk HPV: NEGATIVE

## 2019-11-02 NOTE — Telephone Encounter (Signed)
-----   Message from Mar Daring, [place] sent at 11/02/2019  2:05 PM EST ----- Pap is normal, HPV negative.  Will repeat in 5 years if patient desires. Guidelines state not recommended after 65.

## 2019-11-02 NOTE — Telephone Encounter (Signed)
Pt advised.   Thanks,   -Laura  

## 2019-11-06 ENCOUNTER — Encounter: Payer: Self-pay | Admitting: Physician Assistant

## 2019-11-06 DIAGNOSIS — F5101 Primary insomnia: Secondary | ICD-10-CM

## 2019-11-06 DIAGNOSIS — F3341 Major depressive disorder, recurrent, in partial remission: Secondary | ICD-10-CM

## 2019-11-08 MED ORDER — FLUOXETINE HCL 20 MG PO CAPS: 20.0000 mg | ORAL_CAPSULE | Freq: Every day | ORAL | 3 refills | Status: DC

## 2019-11-08 MED ORDER — ZOLPIDEM TARTRATE 10 MG PO TABS: 5.0000 mg | ORAL_TABLET | Freq: Every evening | ORAL | 1 refills | Status: DC | PRN

## 2020-05-12 ENCOUNTER — Other Ambulatory Visit: Payer: Self-pay | Admitting: Physician Assistant

## 2020-05-12 ENCOUNTER — Encounter: Payer: Self-pay | Admitting: Physician Assistant

## 2020-05-12 DIAGNOSIS — F5101 Primary insomnia: Secondary | ICD-10-CM

## 2020-05-12 MED ORDER — ZOLPIDEM TARTRATE 10 MG PO TABS: 5.0000 mg | ORAL_TABLET | Freq: Every evening | ORAL | 1 refills | Status: DC | PRN

## 2020-05-15 NOTE — Telephone Encounter (Signed)
Jenni's patient and she is out of the office this week.

## 2020-05-15 NOTE — Telephone Encounter (Signed)
Please advise 

## 2020-11-01 ENCOUNTER — Other Ambulatory Visit: Payer: Self-pay

## 2020-11-01 ENCOUNTER — Ambulatory Visit (INDEPENDENT_AMBULATORY_CARE_PROVIDER_SITE_OTHER): Payer: Medicare PPO | Admitting: Physician Assistant

## 2020-11-01 ENCOUNTER — Encounter: Payer: Self-pay | Admitting: Physician Assistant

## 2020-11-01 VITALS — BP 142/72 | HR 71 | Temp 98.5°F | Resp 16 | Ht 62.0 in | Wt 136.8 lb

## 2020-11-01 DIAGNOSIS — F5101 Primary insomnia: Secondary | ICD-10-CM | POA: Diagnosis not present

## 2020-11-01 DIAGNOSIS — E039 Hypothyroidism, unspecified: Secondary | ICD-10-CM

## 2020-11-01 DIAGNOSIS — Z0000 Encounter for general adult medical examination without abnormal findings: Secondary | ICD-10-CM

## 2020-11-01 DIAGNOSIS — Z23 Encounter for immunization: Secondary | ICD-10-CM

## 2020-11-01 DIAGNOSIS — Z780 Asymptomatic menopausal state: Secondary | ICD-10-CM

## 2020-11-01 DIAGNOSIS — E785 Hyperlipidemia, unspecified: Secondary | ICD-10-CM

## 2020-11-01 DIAGNOSIS — E559 Vitamin D deficiency, unspecified: Secondary | ICD-10-CM

## 2020-11-01 DIAGNOSIS — Z1239 Encounter for other screening for malignant neoplasm of breast: Secondary | ICD-10-CM

## 2020-11-01 DIAGNOSIS — F3341 Major depressive disorder, recurrent, in partial remission: Secondary | ICD-10-CM

## 2020-11-01 DIAGNOSIS — Z87891 Personal history of nicotine dependence: Secondary | ICD-10-CM

## 2020-11-01 MED ORDER — ATORVASTATIN CALCIUM 10 MG PO TABS: 10.0000 mg | ORAL_TABLET | Freq: Every day | ORAL | 3 refills | Status: DC

## 2020-11-01 MED ORDER — LEVOTHYROXINE SODIUM 75 MCG PO TABS: 75.0000 ug | ORAL_TABLET | Freq: Every day | ORAL | 3 refills | Status: AC

## 2020-11-01 MED ORDER — ZOLPIDEM TARTRATE 10 MG PO TABS: ORAL_TABLET | ORAL | 0 refills | Status: DC

## 2020-11-01 MED ORDER — FLUOXETINE HCL 20 MG PO CAPS: 20.0000 mg | ORAL_CAPSULE | Freq: Every day | ORAL | 3 refills | Status: AC

## 2020-11-01 NOTE — Patient Instructions (Addendum)
[facility] [place],  [place]  [address] [address]: [phone] Tell you have a bone density ordered to have scheduled together   Preventive Care 65 Years and Older, Female Preventive care refers to lifestyle choices and visits with your health care provider that can promote health and wellness. This includes:  A yearly physical exam. This is also called an annual well check.  Regular dental and eye exams.  Immunizations.  Screening for certain conditions.  Healthy lifestyle choices, such as diet and exercise. What can I expect for my preventive care visit? Physical exam Your health care provider will check:  Height and weight. These may be used to calculate body mass index (BMI), which is a measurement that tells if you are at a healthy weight.  Heart rate and blood pressure.  Your skin for abnormal spots. Counseling Your health care provider may ask you questions about:  Alcohol, tobacco, and drug use.  Emotional well-being.  Home and relationship well-being.  Sexual activity.  Eating habits.  History of falls.  Memory and ability to understand (cognition).  Work and work Statistician.  Pregnancy and menstrual history. What immunizations do I need?  Influenza (flu) vaccine  This is recommended every year. Tetanus, diphtheria, and pertussis (Tdap) vaccine  You may need a Td booster every 10 years. Varicella (chickenpox) vaccine  You may need this vaccine if you have not already been vaccinated. Zoster (shingles) vaccine  You may need this after age 8. Pneumococcal conjugate (PCV13) vaccine  One dose is recommended after age 21. Pneumococcal polysaccharide (PPSV23) vaccine  One dose is recommended after age 9. Measles, mumps, and rubella (MMR) vaccine  You may need at least one dose of MMR if you were born in 1957 or later. You may also need a second dose. Meningococcal conjugate (MenACWY)  vaccine  You may need this if you have certain conditions. Hepatitis A vaccine  You may need this if you have certain conditions or if you travel or work in places where you may be exposed to hepatitis A. Hepatitis B vaccine  You may need this if you have certain conditions or if you travel or work in places where you may be exposed to hepatitis B. Haemophilus influenzae type b (Hib) vaccine  You may need this if you have certain conditions. You may receive vaccines as individual doses or as more than one vaccine together in one shot (combination vaccines). Talk with your health care provider about the risks and benefits of combination vaccines. What tests do I need? Blood tests  Lipid and cholesterol levels. These may be checked every 5 years, or more frequently depending on your overall health.  Hepatitis C test.  Hepatitis B test. Screening  Lung cancer screening. You may have this screening every year starting at age 36 if you have a 30-pack-year history of smoking and currently smoke or have quit within the past 15 years.  Colorectal cancer screening. All adults should have this screening starting at age 69 and continuing until age 11. Your health care provider may recommend screening at age 63 if you are at increased risk. You will have tests every 1-10 years, depending on your results and the type of screening test.  Diabetes screening. This is done by checking your blood sugar (glucose) after you have not eaten for a while (fasting). You may have this done every 1-3 years.  Mammogram. This may be done every 1-2 years. Talk with your health  care provider about how often you should have regular mammograms.  BRCA-related cancer screening. This may be done if you have a family history of breast, ovarian, tubal, or peritoneal cancers. Other tests  Sexually transmitted disease (STD) testing.  Bone density scan. This is done to screen for osteoporosis. You may have this done  starting at age 34. Follow these instructions at home: Eating and drinking  Eat a diet that includes fresh fruits and vegetables, whole grains, lean protein, and low-fat dairy products. Limit your intake of foods with high amounts of sugar, saturated fats, and salt.  Take vitamin and mineral supplements as recommended by your health care provider.  Do not drink alcohol if your health care provider tells you not to drink.  If you drink alcohol: ? Limit how much you have to 0-1 drink a day. ? Be aware of how much alcohol is in your drink. In the [place] one drink equals one 12 oz bottle of beer (355 mL), one 5 oz glass of wine (148 mL), or one 1 oz glass of hard liquor (44 mL). Lifestyle  Take daily care of your teeth and gums.  Stay active. Exercise for at least 30 minutes on 5 or more days each week.  Do not use any products that contain nicotine or tobacco, such as cigarettes, e-cigarettes, and chewing tobacco. If you need help quitting, ask your health care provider.  If you are sexually active, practice safe sex. Use a condom or other form of protection in order to prevent STIs (sexually transmitted infections).  Talk with your health care provider about taking a low-dose aspirin or statin. What's next?  Go to your health care provider once a year for a well check visit.  Ask your health care provider how often you should have your eyes and teeth checked.  Stay up to date on all vaccines. This information is not intended to replace advice given to you by your health care provider. Make sure you discuss any questions you have with your health care provider. Document Revised: 11/05/2018 Document Reviewed: 11/05/2018 Elsevier Patient Education  2020 [employer].

## 2020-11-01 NOTE — Progress Notes (Signed)
Complete physical exam   Patient: Brittany Reese   DOB: 28-Dec-1955   64 y.o. Female  MRN: [id number] Visit Date: 11/01/2020  Today's healthcare provider: Margaretann Loveless, PA-C   Chief Complaint  Patient presents with  . Medicare Wellness   Subjective    Brittany Reese is a 64 y.o. female who presents today for a complete physical exam.  She reports consuming a general diet. Home exercise routine includes walks once a week. She generally feels well. She reports sleeping well. She does have additional problems to discuss today. Needs refill on medicines. HPI    Past Medical History:  Diagnosis Date  . Depression   . Hyperlipidemia   . Thyroid disease    No past surgical history on file. Social History   Socioeconomic History  . Marital status: Married    Spouse name: Not on file  . Number of children: Not on file  . Years of education: Not on file  . Highest education level: Not on file  Occupational History  . Not on file  Tobacco Use  . Smoking status: Former Smoker    Types: Cigarettes    Quit date: 10/18/2014    Years since quitting: 6.0  . Smokeless tobacco: Never Used  Vaping Use  . Vaping Use: Never used  Substance and Sexual Activity  . Alcohol use: Yes    Alcohol/week: 3.0 standard drinks    Types: 3 Cans of beer per week    Comment: occasional  . Drug use: No  . Sexual activity: Never    Birth control/protection: None  Other Topics Concern  . Not on file  Social History Narrative  . Not on file   Social Determinants of Health   Financial Resource Strain:   . Difficulty of Paying Living Expenses: Not on file  Food Insecurity:   . Worried About Programme researcher, broadcasting/film/video in the Last Year: Not on file  . Ran Out of Food in the Last Year: Not on file  Transportation Needs:   . Lack of Transportation (Medical): Not on file  . Lack of Transportation (Non-Medical): Not on file  Physical Activity:   . Days of Exercise per Week: Not on file   . Minutes of Exercise per Session: Not on file  Stress:   . Feeling of Stress : Not on file  Social Connections:   . Frequency of Communication with Friends and Family: Not on file  . Frequency of Social Gatherings with Friends and Family: Not on file  . Attends Religious Services: Not on file  . Active Member of Clubs or Organizations: Not on file  . Attends Banker Meetings: Not on file  . Marital Status: Not on file  Intimate Partner Violence:   . Fear of Current or Ex-Partner: Not on file  . Emotionally Abused: Not on file  . Physically Abused: Not on file  . Sexually Abused: Not on file   Family Status  Relation Name Status  . Mother  Deceased  . Father  Deceased  . Brother  Alive  . Brother  Alive  . Neg Hx  (Not Specified)   Family History  Problem Relation Age of Onset  . COPD Mother   . Hypertension Mother   . Heart disease Mother   . Kidney disease Father   . Healthy Brother   . Healthy Brother   . Breast cancer Neg Hx    No Known Allergies  Patient Care  Team: [name] as PCP - General (Family Medicine)   Medications: Outpatient Medications Prior to Visit  Medication Sig  . atorvastatin (LIPITOR) 10 MG tablet Take 1 tablet (10 mg total) by mouth at bedtime.  . clobetasol ointment (TEMOVATE) 0.05 % Apply 1 application topically at bedtime.  . fluocinonide-emollient (LIDEX-E) 0.05 % cream Apply 1 application topically 2 (two) times daily.  Marland Kitchen FLUoxetine (PROZAC) 20 MG capsule Take 1 capsule (20 mg total) by mouth daily.  Marland Kitchen levothyroxine (SYNTHROID) 75 MCG tablet Take 1 tablet (75 mcg total) by mouth daily before breakfast.  . zolpidem (AMBIEN) 10 MG tablet TAKE 1/2 TO 1 (ONE-HALF TO ONE) TABLET BY MOUTH AT BEDTIME AS NEEDED FOR SLEEP  . zolpidem (AMBIEN) 10 MG tablet Take 0.5-1 tablets (5-10 mg total) by mouth at bedtime as needed for sleep.   No facility-administered medications prior to visit.    Review of Systems   Constitutional: Negative.   HENT: Negative.   Eyes: Negative.   Respiratory: Negative.   Cardiovascular: Negative.   Gastrointestinal: Negative.   Endocrine: Negative.   Genitourinary: Negative.   Musculoskeletal: Negative.   Skin: Negative.   Allergic/Immunologic: Negative.   Neurological: Negative.   Hematological: Negative.   Psychiatric/Behavioral: Negative.     Last CBC Lab Results  Component Value Date   WBC 6.6 11/01/2020   HGB 13.4 11/01/2020   HCT 40.2 11/01/2020   MCV 81 11/01/2020   MCH 27.1 11/01/2020   RDW 13.1 11/01/2020   PLT 246 11/01/2020   Last metabolic panel Lab Results  Component Value Date   GLUCOSE 91 11/01/2020   NA 142 11/01/2020   K 4.3 11/01/2020   CL 104 11/01/2020   CO2 23 11/01/2020   BUN 12 11/01/2020   CREATININE 1.11 (H) 11/01/2020   GFRNONAA 52 (L) 11/01/2020   GFRAA 60 11/01/2020   CALCIUM 10.0 11/01/2020   PROT 6.9 11/01/2020   ALBUMIN 4.8 11/01/2020   LABGLOB 2.1 11/01/2020   AGRATIO 2.3 (H) 11/01/2020   BILITOT 0.4 11/01/2020   ALKPHOS 110 11/01/2020   AST 19 11/01/2020   ALT 14 11/01/2020      Objective    BP (!) 142/72 (BP Location: Left Arm, Patient Position: Sitting, Cuff Size: Large)   Pulse 71   Temp 98.5 F (36.9 C) (Oral)   Resp 16   Ht 5\' 2"  (1.575 m)   Wt 136 lb 12.8 oz (62.1 kg)   BMI 25.02 kg/m  BP Readings from Last 3 Encounters:  11/01/20 (!) 142/72  10/29/19 132/76  10/26/18 130/82   Wt Readings from Last 3 Encounters:  11/01/20 136 lb 12.8 oz (62.1 kg)  10/29/19 138 lb (62.6 kg)  10/26/18 134 lb 12.8 oz (61.1 kg)      Physical Exam Vitals reviewed.  Constitutional:      General: She is not in acute distress.    Appearance: Normal appearance. She is well-developed, well-groomed, overweight and well-nourished. She is not ill-appearing or diaphoretic.  HENT:     Head: Normocephalic and atraumatic.     Right Ear: Tympanic membrane, ear canal and external ear normal.     Left Ear:  Tympanic membrane, ear canal and external ear normal.     Nose: Nose normal.     Mouth/Throat:     Mouth: Oropharynx is clear and moist. Mucous membranes are moist.     Pharynx: Oropharynx is clear. No oropharyngeal exudate or posterior oropharyngeal erythema.  Eyes:  General: No scleral icterus.       Right eye: No discharge.        Left eye: No discharge.     Extraocular Movements: Extraocular movements intact and EOM normal.     Conjunctiva/sclera: Conjunctivae normal.     Pupils: Pupils are equal, round, and reactive to light.  Neck:     Thyroid: No thyromegaly.     Vascular: No carotid bruit or JVD.     Trachea: No tracheal deviation.  Cardiovascular:     Rate and Rhythm: Normal rate and regular rhythm.     Pulses: Normal pulses and intact distal pulses.     Heart sounds: Normal heart sounds. No murmur heard. No friction rub. No gallop.   Pulmonary:     Effort: Pulmonary effort is normal. No respiratory distress.     Breath sounds: Normal breath sounds. No wheezing or rales.  Chest:     Chest wall: No tenderness.  Abdominal:     General: Abdomen is flat. Bowel sounds are normal. There is no distension.     Palpations: Abdomen is soft. There is no mass.     Tenderness: There is no abdominal tenderness. There is no guarding or rebound.  Musculoskeletal:        General: No tenderness or edema. Normal range of motion.     Cervical back: Normal range of motion and neck supple. No tenderness.     Right lower leg: No edema.     Left lower leg: No edema.  Lymphadenopathy:     Cervical: No cervical adenopathy.  Skin:    General: Skin is warm and dry.     Capillary Refill: Capillary refill takes less than 2 seconds.     Findings: No rash.  Neurological:     General: No focal deficit present.     Mental Status: She is alert and oriented to person, place, and time. Mental status is at baseline.  Psychiatric:        Mood and Affect: Mood and affect and mood normal.         Behavior: Behavior normal. Behavior is cooperative.        Thought Content: Thought content normal.        Judgment: Judgment normal.      Last depression screening scores PHQ 2/9 Scores 11/01/2020 10/29/2019 10/26/2018  PHQ - 2 Score PHQ- 9 Score Exception Documentation - - -   Last fall risk screening Fall Risk  11/01/2020  Falls in the past year? 0  Number falls in past yr: 0  Injury with Fall? 0  Risk for fall due to : No Fall Risks  Follow up Falls evaluation completed   Last Audit-C alcohol use screening Alcohol Use Disorder Test (AUDIT) 11/01/2020  1. How often do you have a drink containing alcohol? 3  2. How many drinks containing alcohol do you have on a typical day when you are drinking? 2  3. How often do you have six or more drinks on one occasion? 3  AUDIT-C Score 8  4. How often during the last year have you found that you were not able to stop drinking once you had started? -  5. How often during the last year have you failed to do what was normally expected from you because of drinking? -  6. How often during the last year have you needed a first drink in the morning to get yourself going  after a heavy drinking session? -  7. How often during the last year have you had a feeling of guilt of remorse after drinking? -  8. How often during the last year have you been unable to remember what happened the night before because you had been drinking? -  9. Have you or someone else been injured as a result of your drinking? -  10. Has a relative or friend or a doctor or another health worker been concerned about your drinking or suggested you cut down? -  Alcohol Use Disorder Identification Test Final Score (AUDIT) -  Alcohol Brief Interventions/Follow-up -   A score of 3 or more in women, and 4 or more in men indicates increased risk for alcohol abuse, EXCEPT if all of the points are from question 1   No results found for any visits on 11/01/20.  Assessment &  Plan    Routine Health Maintenance and Physical Exam  Exercise Activities and Dietary recommendations Goals    . Exercise 150 minutes per week (moderate activity)       Immunization History  Administered Date(s) Administered  . Influenza,inj,Quad PF,6+ Mos 10/06/2015  . Influenza-Unspecified 07/21/2017, 09/07/2019  . Tdap 04/29/2005, 07/21/2017    Health Maintenance  Topic Date Due  . COVID-19 Vaccine (1) Never done  . MAMMOGRAM  10/03/2019  . INFLUENZA VACCINE  06/25/2020  . PNA vac Low Risk Adult (1 of 2 - PCV13) 10/09/2020  . COLONOSCOPY  01/16/2022  . PAP SMEAR-Modifier  10/28/2022  . TETANUS/TDAP  07/22/2027  . DEXA SCAN  Completed  . Hepatitis C Screening  Completed  . HIV Screening  Completed    Discussed health benefits of physical activity, and encouraged her to engage in regular exercise appropriate for her age and condition.  1. Annual physical exam Normal physical exam today. Will check labs as below and f/u pending lab results. If labs are stable and WNL she will not need to have these rechecked for one year at her next annual physical exam. She is to call the office in the meantime if she has any acute issue, questions or concerns.  2. Encounter for breast cancer screening using non-mammogram modality Breast exam today was normal. There is no family history of breast cancer. She does perform regular self breast exams. Mammogram was ordered as below. Information for [facility] clinic was given to patient so she may schedule her mammogram at her convenience. - MM 3D SCREEN BREAST BILATERAL; Future  3. Primary insomnia Stable. Diagnosis pulled for medication refill. Continue current medical treatment plan. - zolpidem (AMBIEN) 10 MG tablet; TAKE 1/2 TO 1 (ONE-HALF TO ONE) TABLET BY MOUTH AT BEDTIME AS NEEDED FOR SLEEP  Dispense: 45 tablet; Refill: 0  4. Recurrent major depressive disorder, in partial remission (HCC) Stable. Diagnosis pulled for medication  refill. Continue current medical treatment plan. - FLUoxetine (PROZAC) 20 MG capsule; Take 1 capsule (20 mg total) by mouth daily.  Dispense: 90 capsule; Refill: 3 - CBC w/Diff/Platelet  5. Hyperlipidemia, unspecified hyperlipidemia type Stable. Diagnosis pulled for medication refill. Continue current medical treatment plan. Will check labs as below and f/u pending results. - atorvastatin (LIPITOR) 10 MG tablet; Take 1 tablet (10 mg total) by mouth at bedtime.  Dispense: 90 tablet; Refill: 3 - CBC w/Diff/Platelet - Comprehensive Metabolic Panel (CMET) - Lipid Panel With LDL/HDL Ratio  6. Adult hypothyroidism Stable. Diagnosis pulled for medication refill. Continue current medical treatment plan. Will check labs as below and f/u  pending results. - levothyroxine (SYNTHROID) 75 MCG tablet; Take 1 tablet (75 mcg total) by mouth daily before breakfast.  Dispense: 90 tablet; Refill: 3 - TSH  7. Avitaminosis D H/O this and postmenopausal. Will check labs as below and f/u pending results. - CBC w/Diff/Platelet  8. Postmenopausal estrogen deficiency Due for screening bone density. Ordered as below.  - DG Bone Density; Future  9. Former smoker Previous smoker, but still qualifies for lung cancer screening. Will order as below.  - Ambulatory Referral for Lung Cancer Scre  10. Need for influenza vaccination Flu vaccine given today without complication. Patient sat upright for 15 minutes to check for adverse reaction before being released. - Flu Vaccine QUAD High Dose(Fluad)  11. Need for pneumococcal vaccination Patient will await pneumonia vaccine at this time. Can get in 2 weeks if desired.    No follow-ups on file.     Delmer IslamI, Kamdyn Covel M Dashton Czerwinski, PA-C, have reviewed all documentation for this visit. The documentation on 11/07/20 for the exam, diagnosis, procedures, and orders are all accurate and complete.   Reine JustJennifer M Quantarius Genrich, PA-C  [facility] [phone]  (phone) [phone] (fax)  [facility]

## 2020-11-02 ENCOUNTER — Telehealth: Payer: Self-pay

## 2020-11-02 LAB — LIPID PANEL WITH LDL/HDL RATIO
Cholesterol, Total: 211 mg/dL — ABNORMAL HIGH (ref 100–199)
HDL: 54 mg/dL (ref >39)
LDL Chol Calc (NIH): 133 mg/dL — ABNORMAL HIGH (ref 0–99)
LDL/HDL Ratio: 2.5 ratio (ref 0.0–3.2)
Triglycerides: 133 mg/dL (ref 0–149)
VLDL Cholesterol Cal: 24 mg/dL (ref 5–40)

## 2020-11-02 LAB — CBC WITH DIFFERENTIAL/PLATELET
Basophils Absolute: 0 10*3/uL (ref 0.0–0.2)
Basos: 1 %
EOS (ABSOLUTE): 0.1 10*3/uL (ref 0.0–0.4)
Eos: 2 %
Hematocrit: 40.2 % (ref 34.0–46.6)
Hemoglobin: 13.4 g/dL (ref 11.1–15.9)
Immature Grans (Abs): 0 10*3/uL (ref 0.0–0.1)
Immature Granulocytes: 0 %
Lymphocytes Absolute: 1.8 10*3/uL (ref 0.7–3.1)
Lymphs: 27 %
MCH: 27.1 pg (ref 26.6–33.0)
MCHC: 33.3 g/dL (ref 31.5–35.7)
MCV: 81 fL (ref 79–97)
Monocytes Absolute: 0.5 10*3/uL (ref 0.1–0.9)
Monocytes: 7 %
Neutrophils Absolute: 4.2 10*3/uL (ref 1.4–7.0)
Neutrophils: 63 %
Platelets: 246 10*3/uL (ref 150–450)
RBC: 4.94 x10E6/uL (ref 3.77–5.28)
RDW: 13.1 % (ref 11.7–15.4)
WBC: 6.6 10*3/uL (ref 3.4–10.8)

## 2020-11-02 LAB — COMPREHENSIVE METABOLIC PANEL
ALT: 14 IU/L (ref 0–32)
AST: 19 IU/L (ref 0–40)
Albumin/Globulin Ratio: 2.3 — ABNORMAL HIGH (ref 1.2–2.2)
Albumin: 4.8 g/dL (ref 3.8–4.8)
Alkaline Phosphatase: 110 IU/L (ref 44–121)
BUN/Creatinine Ratio: 11 — ABNORMAL LOW (ref 12–28)
BUN: 12 mg/dL (ref 8–27)
Bilirubin Total: 0.4 mg/dL (ref 0.0–1.2)
CO2: 23 mmol/L (ref 20–29)
Calcium: 10 mg/dL (ref 8.7–10.3)
Chloride: 104 mmol/L (ref 96–106)
Creatinine, Ser: 1.11 mg/dL — ABNORMAL HIGH (ref 0.57–1.00)
GFR calc Af Amer: 60 mL/min/{1.73_m2} (ref >59)
GFR calc non Af Amer: 52 mL/min/{1.73_m2} — ABNORMAL LOW (ref >59)
Globulin, Total: 2.1 g/dL (ref 1.5–4.5)
Glucose: 91 mg/dL (ref 65–99)
Potassium: 4.3 mmol/L (ref 3.5–5.2)
Sodium: 142 mmol/L (ref 134–144)
Total Protein: 6.9 g/dL (ref 6.0–8.5)

## 2020-11-02 LAB — TSH: TSH: 0.933 u[IU]/mL (ref 0.450–4.500)

## 2020-11-02 NOTE — Telephone Encounter (Signed)
-----   Message from Margaretann Loveless, [place] sent at 11/02/2020 10:32 AM EST ----- Blood count is normal. Kidney and liver enzymes are stable. Sodium, potassium, and calcium are normal. Thyroid is normal. Cholesterol did increase slightly compared to last year. Work on healthy lifestyle modifications with limiting fatty foods, red meats, processed meats and sugars in diet.

## 2020-11-02 NOTE — Telephone Encounter (Signed)
LMTCB 11/02/2020. PEC Please advise pt of lab results when she calls back.   Thanks,   -Vernona Rieger

## 2020-11-03 ENCOUNTER — Encounter: Payer: Self-pay | Admitting: Physician Assistant

## 2020-11-03 NOTE — Telephone Encounter (Signed)
Patient advised.

## 2020-11-07 ENCOUNTER — Encounter: Payer: Self-pay | Admitting: Physician Assistant

## 2020-11-10 ENCOUNTER — Telehealth: Payer: Self-pay | Admitting: *Deleted

## 2020-11-10 DIAGNOSIS — Z87891 Personal history of nicotine dependence: Secondary | ICD-10-CM

## 2020-11-10 DIAGNOSIS — Z122 Encounter for screening for malignant neoplasm of respiratory organs: Secondary | ICD-10-CM

## 2020-11-10 NOTE — Telephone Encounter (Signed)
Received referral for initial lung cancer screening scan. Contacted patient and obtained smoking history,(former, quit 10/18/14, 41 pack year) as well as answering questions related to screening process. Patient denies signs of lung cancer such as weight loss or hemoptysis. Patient denies comorbidity that would prevent curative treatment if lung cancer were found. Patient is scheduled for shared decision making visit and CT scan on 12/05/20 at 145pm.

## 2020-11-28 ENCOUNTER — Telehealth: Payer: Self-pay

## 2020-11-28 NOTE — Telephone Encounter (Signed)
Unable to find any message that someone from here called patient.  Patient advised if she is needed someone would call her again  Copied from CRM [id number]. Topic: General - Other >> Nov 28, 2020  1:45 PM Tamela Oddi wrote: Reason for CRM: Patient stated she is returning a call to the office.  She is not sure why and who called.  Please advise and call patient back at [phone]

## 2020-11-30 ENCOUNTER — Telehealth: Payer: Self-pay

## 2020-11-30 NOTE — Telephone Encounter (Signed)
Copied from CRM [id number]. Topic: General - Other >> Nov 30, 2020  1:21 PM Jaquita Rector A wrote: Reason for CRM: Patient called in to inform Joycelyn Man that she received a bill from the [employer] for her blood work that was done on the day she had her annual physical. Asking for a call back from someone that handles the billing. Can be reached at Ph# [phone]

## 2020-12-01 ENCOUNTER — Encounter: Payer: Self-pay | Admitting: *Deleted

## 2020-12-05 ENCOUNTER — Inpatient Hospital Stay: Payer: Medicare PPO | Attending: Nurse Practitioner | Admitting: Nurse Practitioner

## 2020-12-05 ENCOUNTER — Other Ambulatory Visit: Payer: Self-pay

## 2020-12-05 ENCOUNTER — Ambulatory Visit
Admission: RE | Admit: 2020-12-05 | Discharge: 2020-12-05 | Disposition: A | Discharge status: Home / Self Care | Payer: Medicare PPO | Source: Ambulatory Visit | Attending: Nurse Practitioner | Admitting: Nurse Practitioner

## 2020-12-05 DIAGNOSIS — Z87891 Personal history of nicotine dependence: Secondary | ICD-10-CM | POA: Insufficient documentation

## 2020-12-05 DIAGNOSIS — Z122 Encounter for screening for malignant neoplasm of respiratory organs: Secondary | ICD-10-CM | POA: Insufficient documentation

## 2020-12-05 NOTE — Progress Notes (Signed)
Virtual Visit via Video Enabled Telemedicine Note   I connected with Brittany Reese on 12/05/20 at 1:45 PM EST by video enabled telemedicine visit and verified that I am speaking with the correct person using two identifiers.   I discussed the limitations, risks, security and privacy concerns of performing an evaluation and management service by telemedicine and the availability of in-person appointments. I also discussed with the patient that there may be a patient responsible charge related to this service. The patient expressed understanding and agreed to proceed.   Other persons participating in the visit and their role in the encounter: Burgess Estelle, RN- checking in patient & navigation  Patient's location: [place]  Provider's location: Clinic  Chief Complaint: Low Dose CT Screening  Patient agreed to evaluation by telemedicine to discuss shared decision making for consideration of low dose CT lung cancer screening.    In accordance with CMS guidelines, patient has met eligibility criteria including age, absence of signs or symptoms of lung cancer.  Social History   Tobacco Use  . Smoking status: Former Smoker    Packs/day: 1.00    Years: 41.00    Pack years: 41.00    Types: Cigarettes    Quit date: 10/18/2014    Years since quitting: 6.1  . Smokeless tobacco: Never Used  Substance Use Topics  . Alcohol use: Yes    Alcohol/week: 3.0 standard drinks    Types: 3 Cans of beer per week    Comment: occasional     A shared decision-making session was conducted prior to the performance of CT scan. This includes one or more decision aids, includes benefits and harms of screening, follow-up diagnostic testing, over-diagnosis, false positive rate, and total radiation exposure.   Counseling on the importance of adherence to annual lung cancer LDCT screening, impact of co-morbidities, and ability or willingness to undergo diagnosis and treatment is imperative for compliance  of the program.   Counseling on the importance of continued smoking cessation for former smokers; the importance of smoking cessation for current smokers, and information about tobacco cessation interventions have been given to patient including [place] and [employer] [place] programs.   Written order for lung cancer screening with LDCT has been given to the patient and any and all questions have been answered to the best of my abilities.    Yearly follow up will be coordinated by Burgess Estelle, Thoracic Navigator.  I discussed the assessment and treatment plan with the patient. The patient was provided an opportunity to ask questions and all were answered. The patient agreed with the plan and demonstrated an understanding of the instructions.   The patient was advised to call back or seek an in-person evaluation if the symptoms worsen or if the condition fails to improve as anticipated.   I provided 15 minutes of face-to-face video visit time dedicated to the care of this patient on the date of this encounter to include pre-visit review of smoking history, face-to-face time with the patient, and post visit ordering of testing/documentation.   Beckey Rutter, DNP, AGNP-C [place] [facility] [phone] (clinic)

## 2020-12-11 ENCOUNTER — Telehealth: Payer: Self-pay | Admitting: *Deleted

## 2020-12-11 NOTE — Telephone Encounter (Signed)
Notified patient of LDCT lung cancer screening program results with recommendation for 12 month follow up imaging. Also notified of incidental findings noted below and is encouraged to discuss further with PCP who will receive a copy of this note and/or the CT report. Patient verbalizes understanding.   IMPRESSION: 1. Lung-RADS 2, benign appearance or behavior. Continue annual screening with low-dose chest CT without contrast in 12 months. 2. Two-vessel coronary atherosclerosis. 3. Aortic Atherosclerosis (ICD10-I70.0) and Emphysema (ICD10-J43.9).  

## 2020-12-21 ENCOUNTER — Encounter: Payer: Self-pay | Admitting: Physician Assistant

## 2020-12-26 NOTE — Telephone Encounter (Signed)
Left message for the patient to bring bill to the office and we will call and see what dx code was used and if we can change/add anything.

## 2021-01-08 ENCOUNTER — Ambulatory Visit: Payer: Medicare PPO | Admitting: Cardiology

## 2021-01-08 ENCOUNTER — Encounter: Payer: Self-pay | Admitting: Cardiology

## 2021-01-08 ENCOUNTER — Other Ambulatory Visit: Payer: Self-pay

## 2021-01-08 VITALS — BP 138/60 | HR 82 | Ht 62.0 in | Wt 136.4 lb

## 2021-01-08 DIAGNOSIS — E785 Hyperlipidemia, unspecified: Secondary | ICD-10-CM

## 2021-01-08 DIAGNOSIS — I2510 Atherosclerotic heart disease of native coronary artery without angina pectoris: Secondary | ICD-10-CM

## 2021-01-08 DIAGNOSIS — E7800 Pure hypercholesterolemia, unspecified: Secondary | ICD-10-CM

## 2021-01-08 MED ORDER — ASPIRIN EC 81 MG PO TBEC: 81.0000 mg | DELAYED_RELEASE_TABLET | Freq: Every day | ORAL | 3 refills | Status: AC

## 2021-01-08 MED ORDER — ATORVASTATIN CALCIUM 40 MG PO TABS: 40.0000 mg | ORAL_TABLET | Freq: Every day | ORAL | 5 refills | Status: DC

## 2021-01-08 NOTE — Progress Notes (Signed)
Cardiology Office Note:    Date:  01/08/2021   ID:  Brittany Reese, DOB [birth year], MRN [id number]  PCP:  [name]   [facility]  Cardiologist:  Debbe Odea, MD  Advanced Practice Provider:  No care team member to display Electrophysiologist:  None    Referring MD: Suella Grove*   Chief Complaint  Patient presents with  . New Patient (Initial Visit)    Self ref for a recent CT scan that showed Aortic Atherosclerosis. Medications reviewed by the patient verbally. Patient c/o irregular heart beats at times.      History of Present Illness:    Brittany Reese is a 65 y.o. female with a hx of hyperlipidemia, former smoker x20+ years who presents due to coronary artery calcifications.  Patient had a screening noncontrast chest CT performed due to history of smoking.  CT showed centrilobular emphysema and atherosclerosis in the LAD and RCA.  She denies chest pain or shortness of breath.  Family history of MI in her mother around age 48s to 71s.  States having a little bit of anxiety but otherwise doing okay.   Past Medical History:  Diagnosis Date  . Depression   . Hyperlipidemia   . Thyroid disease     History reviewed. No pertinent surgical history.  Current Medications: Current Meds  Medication Sig  . aspirin EC 81 MG tablet Take 1 tablet (81 mg total) by mouth daily. Swallow whole.  . clobetasol ointment (TEMOVATE) 0.05 % Apply 1 application topically at bedtime.  . fluocinonide-emollient (LIDEX-E) 0.05 % cream Apply 1 application topically 2 (two) times daily.  Marland Kitchen FLUoxetine (PROZAC) 20 MG capsule Take 1 capsule (20 mg total) by mouth daily.  Marland Kitchen levothyroxine (SYNTHROID) 75 MCG tablet Take 1 tablet (75 mcg total) by mouth daily before breakfast.  . zolpidem (AMBIEN) 10 MG tablet TAKE 1/2 TO 1 (ONE-HALF TO ONE) TABLET BY MOUTH AT BEDTIME AS NEEDED FOR SLEEP  . [DISCONTINUED] atorvastatin (LIPITOR) 10 MG tablet  Take 1 tablet (10 mg total) by mouth at bedtime.     Allergies:   Patient has no known allergies.   Social History   Socioeconomic History  . Marital status: Married    Spouse name: Not on file  . Number of children: Not on file  . Years of education: Not on file  . Highest education level: Not on file  Occupational History  . Not on file  Tobacco Use  . Smoking status: Former Smoker    Packs/day: 1.00    Years: 41.00    Pack years: 41.00    Types: Cigarettes    Quit date: 10/18/2014    Years since quitting: 6.2  . Smokeless tobacco: Never Used  Vaping Use  . Vaping Use: Never used  Substance and Sexual Activity  . Alcohol use: Yes    Alcohol/week: 3.0 standard drinks    Types: 3 Cans of beer per week    Comment: occasional  . Drug use: No  . Sexual activity: Never    Birth control/protection: None  Other Topics Concern  . Not on file  Social History Narrative  . Not on file   Social Determinants of Health   Financial Resource Strain: Not on file  Food Insecurity: Not on file  Transportation Needs: Not on file  Physical Activity: Not on file  Stress: Not on file  Social Connections: Not on file     Family History: The patient's  family history includes COPD in her mother; Healthy in her brother and brother; Heart disease in her mother; Hypertension in her mother; Kidney disease in her father. There is no history of Breast cancer.  ROS:   Please see the history of present illness.     All other systems reviewed and are negative.  EKGs/Labs/Other Studies Reviewed:    The following studies were reviewed today:   EKG:  EKG is  ordered today.  The ekg ordered today demonstrates sinus rhythm  Recent Labs: 11/01/2020: ALT 14; BUN 12; Creatinine, Ser 1.11; Hemoglobin 13.4; Platelets 246; Potassium 4.3; Sodium 142; TSH 0.933  Recent Lipid Panel    Component Value Date/Time   CHOL 211 (H) 11/01/2020 1405   TRIG 133 11/01/2020 1405   HDL 54 11/01/2020 1405    CHOLHDL 3.9 10/29/2019 1043   CHOLHDL 3.6 10/23/2017 1115   LDLCALC 133 (H) 11/01/2020 1405   LDLCALC 119 (H) 10/23/2017 1115     Risk Assessment/Calculations:      Physical Exam:    VS:  BP 138/60 (BP Location: Right Arm, Patient Position: Sitting, Cuff Size: Normal)   Pulse 82   Ht 5\' 2"  (1.575 m)   Wt 136 lb 6 oz (61.9 kg)   SpO2 98%   BMI 24.94 kg/m     Wt Readings from Last 3 Encounters:  01/08/21 136 lb 6 oz (61.9 kg)  12/05/20 136 lb (61.7 kg)  11/01/20 136 lb 12.8 oz (62.1 kg)     GEN:  Well nourished, well developed in no acute distress HEENT: Normal NECK: No JVD; No carotid bruits LYMPHATICS: No lymphadenopathy CARDIAC: RRR, no murmurs, rubs, gallops RESPIRATORY:  Clear to auscultation without rales, wheezing or rhonchi  ABDOMEN: Soft, non-tender, non-distended MUSCULOSKELETAL:  No edema; No deformity  SKIN: Warm and dry NEUROLOGIC:  Alert and oriented x 3 PSYCHIATRIC:  Normal affect   ASSESSMENT:    1. Coronary artery disease involving native coronary artery of native heart without angina pectoris   2. Pure hypercholesterolemia   3. Hyperlipidemia, unspecified hyperlipidemia type    PLAN:    In order of problems listed above:  1. Coronary artery calcification noted in the LAD and RCA on recent noncontrast chest CT.  She denies chest pain or shortness of breath.  Start aspirin 81 mg daily, increase repeat total to 40 mg daily.  Get echocardiogram to evaluate any wall motion abnormalities. 2. History of hyperlipidemia, goal LDL less than 70.  His Lipitor to 40 mg as above.  Repeat fasting lipid profile in 3 months.  Follow-up in 6 weeks after repeat fasting lipid profile.     Medication Adjustments/Labs and Tests Ordered: Current medicines are reviewed at length with the patient today.  Concerns regarding medicines are outlined above.  Orders Placed This Encounter  Procedures  . Lipid panel  . EKG 12-Lead  . ECHOCARDIOGRAM COMPLETE   Meds  ordered this encounter  Medications  . atorvastatin (LIPITOR) 40 MG tablet    Sig: Take 1 tablet (40 mg total) by mouth at bedtime.    Dispense:  30 tablet    Refill:  5    For future fills  . aspirin EC 81 MG tablet    Sig: Take 1 tablet (81 mg total) by mouth daily. Swallow whole.    Dispense:  90 tablet    Refill:  3    Patient Instructions  Medication Instructions:   Your physician has recommended you make the following change in your medication:  1.  START ASA 81 MG: take one tablet by mouth once daily. 2.  INCREASE your Lipitor to 40 MG once daily.  *If you need a refill on your cardiac medications before your next appointment, please call your pharmacy*   Lab Work:  Your physician recommends that you return for a FASTING lipid profile: 6 weeks (just prior to your follow up apt)  - You will need to be fasting. Please do not have anything to eat or drink after midnight the morning you have the lab work. You may only have water or black coffee with no cream or sugar. - Please go to the [facility]. You will check in at the front desk to the right as you walk into the atrium. Valet Parking is offered if needed. - No appointment needed. You may go any day between 7 am and 6 pm.    Testing/Procedures:  Your physician has requested that you have an echocardiogram. Echocardiography is a painless test that uses sound waves to create images of your heart. It provides your doctor with information about the size and shape of your heart and how well your heart's chambers and valves are working. This procedure takes approximately one hour. There are no restrictions for this procedure.    Follow-Up: At [facility], you and your health needs are our priority.  As part of our continuing mission to provide you with exceptional heart care, we have created designated Provider Care Teams.  These Care Teams include your primary Cardiologist (physician) and Advanced Practice  Providers (APPs -  Physician Assistants and Nurse Practitioners) who all work together to provide you with the care you need, when you need it.  We recommend signing up for the patient portal called "MyChart".  Sign up information is provided on this After Visit Summary.  MyChart is used to connect with patients for Virtual Visits (Telemedicine).  Patients are able to view lab/test results, encounter notes, upcoming appointments, etc.  Non-urgent messages can be sent to your provider as well.   To learn more about what you can do with MyChart, go to [email or web address].    Your next appointment:   6 week(s)  The format for your next appointment:   In Person  Provider:   Debbe Odea, MD   Other Instructions      Signed, Debbe Odea, MD  01/08/2021 3:34 PM    [facility]

## 2021-01-08 NOTE — Patient Instructions (Signed)
Medication Instructions:   Your physician has recommended you make the following change in your medication:   1.  START ASA 81 MG: take one tablet by mouth once daily. 2.  INCREASE your Lipitor to 40 MG once daily.  *If you need a refill on your cardiac medications before your next appointment, please call your pharmacy*   Lab Work:  Your physician recommends that you return for a FASTING lipid profile: 6 weeks (just prior to your follow up apt)  - You will need to be fasting. Please do not have anything to eat or drink after midnight the morning you have the lab work. You may only have water or black coffee with no cream or sugar. - Please go to the [facility]. You will check in at the front desk to the right as you walk into the atrium. Valet Parking is offered if needed. - No appointment needed. You may go any day between 7 am and 6 pm.    Testing/Procedures:  Your physician has requested that you have an echocardiogram. Echocardiography is a painless test that uses sound waves to create images of your heart. It provides your doctor with information about the size and shape of your heart and how well your heart's chambers and valves are working. This procedure takes approximately one hour. There are no restrictions for this procedure.    Follow-Up: At [facility], you and your health needs are our priority.  As part of our continuing mission to provide you with exceptional heart care, we have created designated Provider Care Teams.  These Care Teams include your primary Cardiologist (physician) and Advanced Practice Providers (APPs -  Physician Assistants and Nurse Practitioners) who all work together to provide you with the care you need, when you need it.  We recommend signing up for the patient portal called "MyChart".  Sign up information is provided on this After Visit Summary.  MyChart is used to connect with patients for Virtual Visits (Telemedicine).  Patients are  able to view lab/test results, encounter notes, upcoming appointments, etc.  Non-urgent messages can be sent to your provider as well.   To learn more about what you can do with MyChart, go to [email or web address].    Your next appointment:   6 week(s)  The format for your next appointment:   In Person  Provider:   Debbe Odea, MD   Other Instructions

## 2021-01-10 ENCOUNTER — Encounter: Payer: Self-pay | Admitting: Physician Assistant

## 2021-01-10 ENCOUNTER — Other Ambulatory Visit: Payer: Self-pay

## 2021-01-10 DIAGNOSIS — F5101 Primary insomnia: Secondary | ICD-10-CM

## 2021-01-10 MED ORDER — ZOLPIDEM TARTRATE 10 MG PO TABS: ORAL_TABLET | ORAL | 1 refills | Status: AC

## 2021-01-18 ENCOUNTER — Ambulatory Visit (INDEPENDENT_AMBULATORY_CARE_PROVIDER_SITE_OTHER): Payer: Medicare PPO

## 2021-01-18 ENCOUNTER — Other Ambulatory Visit: Payer: Self-pay

## 2021-01-18 DIAGNOSIS — I2510 Atherosclerotic heart disease of native coronary artery without angina pectoris: Secondary | ICD-10-CM | POA: Diagnosis not present

## 2021-01-18 LAB — ECHOCARDIOGRAM COMPLETE
Area-P 1/2: 3.45 cm2
S' Lateral: 3 cm

## 2021-02-21 ENCOUNTER — Telehealth: Payer: Self-pay

## 2021-02-21 NOTE — Telephone Encounter (Signed)
Called and reminded patient to get her lipid panel drawn prior to her follow up on 02/26/21.

## 2021-02-23 ENCOUNTER — Other Ambulatory Visit
Admission: RE | Admit: 2021-02-23 | Discharge: 2021-02-23 | Disposition: A | Discharge status: Home / Self Care | Payer: Medicare PPO | Attending: Cardiology | Admitting: Cardiology

## 2021-02-23 DIAGNOSIS — E785 Hyperlipidemia, unspecified: Secondary | ICD-10-CM | POA: Insufficient documentation

## 2021-02-23 DIAGNOSIS — E7800 Pure hypercholesterolemia, unspecified: Secondary | ICD-10-CM | POA: Insufficient documentation

## 2021-02-23 LAB — LIPID PANEL
Cholesterol: 154 mg/dL (ref 0–200)
HDL: 47 mg/dL (ref >40)
LDL Cholesterol: 86 mg/dL (ref 0–99)
Total CHOL/HDL Ratio: 3.3 RATIO
Triglycerides: 103 mg/dL (ref <150)
VLDL: 21 mg/dL (ref 0–40)

## 2021-02-26 ENCOUNTER — Other Ambulatory Visit: Payer: Self-pay

## 2021-02-26 ENCOUNTER — Encounter: Payer: Self-pay | Admitting: Cardiology

## 2021-02-26 ENCOUNTER — Ambulatory Visit: Payer: Medicare PPO | Admitting: Cardiology

## 2021-02-26 VITALS — BP 136/70 | HR 71 | Ht 62.0 in | Wt 137.0 lb

## 2021-02-26 DIAGNOSIS — E7800 Pure hypercholesterolemia, unspecified: Secondary | ICD-10-CM | POA: Diagnosis not present

## 2021-02-26 DIAGNOSIS — I2510 Atherosclerotic heart disease of native coronary artery without angina pectoris: Secondary | ICD-10-CM | POA: Diagnosis not present

## 2021-02-26 DIAGNOSIS — E785 Hyperlipidemia, unspecified: Secondary | ICD-10-CM | POA: Diagnosis not present

## 2021-02-26 MED ORDER — ATORVASTATIN CALCIUM 40 MG PO TABS: 40.0000 mg | ORAL_TABLET | Freq: Every day | ORAL | 3 refills | Status: DC

## 2021-02-26 NOTE — Progress Notes (Signed)
Cardiology Office Note:    Date:  02/26/2021   ID:  Brittany Reese, DOB Apr 22, 1956, MRN [id number]  PCP:  Brittany Reese   [facility]  Cardiologist:  Debbe Odea, MD  Advanced Practice Provider:  No care team member to display Electrophysiologist:  None    Referring MD: Suella Grove*   Chief Complaint  Patient presents with  . Other    6 week follow up. Patient states her heart skipped during the ECHO and wanted to know what it means. Meds reviewed verbally with patient.      History of Present Illness:    Brittany Reese is a 65 y.o. female with a hx of hyperlipidemia, former smoker x20+ years who presents due to coronary artery calcifications.  Patient had a screening noncontrast chest CT performed due to history of smoking.  CT showed centrilobular emphysema and atherosclerosis in the LAD and RCA.  Echocardiogram was ordered to evaluate systolic and diastolic function.  Currently denies chest pain or shortness of breath.  Lipitor was increased to 40 mg daily.  Tolerating Lipitor without any adverse effects.  Prior notes.   Family history of MI in her mother around age 11s to 83s.  States having a little bit of anxiety but otherwise doing okay.   Past Medical History:  Diagnosis Date  . Depression   . Hyperlipidemia   . Thyroid disease     History reviewed. No pertinent surgical history.  Current Medications: Current Meds  Medication Sig  . aspirin EC 81 MG tablet Take 1 tablet (81 mg total) by mouth daily. Swallow whole.  . clobetasol ointment (TEMOVATE) 0.05 % Apply 1 application topically at bedtime.  . fluocinonide-emollient (LIDEX-E) 0.05 % cream Apply 1 application topically 2 (two) times daily.  Marland Kitchen FLUoxetine (PROZAC) 20 MG capsule Take 1 capsule (20 mg total) by mouth daily.  Marland Kitchen levothyroxine (SYNTHROID) 75 MCG tablet Take 1 tablet (75 mcg total) by mouth daily before breakfast.  . zolpidem (AMBIEN) 10 MG  tablet TAKE 1/2 TO 1 (ONE-HALF TO ONE) TABLET BY MOUTH AT BEDTIME AS NEEDED FOR SLEEP  . [DISCONTINUED] atorvastatin (LIPITOR) 40 MG tablet Take 1 tablet (40 mg total) by mouth at bedtime.     Allergies:   Patient has no known allergies.   Social History   Socioeconomic History  . Marital status: Married    Spouse name: Not on file  . Number of children: Not on file  . Years of education: Not on file  . Highest education level: Not on file  Occupational History  . Not on file  Tobacco Use  . Smoking status: Former Smoker    Packs/day: 1.00    Years: 41.00    Pack years: 41.00    Types: Cigarettes    Quit date: 10/18/2014    Years since quitting: 6.3  . Smokeless tobacco: Never Used  Vaping Use  . Vaping Use: Never used  Substance and Sexual Activity  . Alcohol use: Yes    Alcohol/week: 3.0 standard drinks    Types: 3 Cans of beer per week    Comment: occasional  . Drug use: No  . Sexual activity: Never    Birth control/protection: None  Other Topics Concern  . Not on file  Social History Narrative  . Not on file   Social Determinants of Health   Financial Resource Strain: Not on file  Food Insecurity: Not on file  Transportation Needs: Not on  file  Physical Activity: Not on file  Stress: Not on file  Social Connections: Not on file     Family History: The patient's family history includes COPD in her mother; Healthy in her brother and brother; Heart disease in her mother; Hypertension in her mother; Kidney disease in her father. There is no history of Breast cancer.  ROS:   Please see the history of present illness.     All other systems reviewed and are negative.  EKGs/Labs/Other Studies Reviewed:    The following studies were reviewed today:   EKG:  EKG not ordered today.   Recent Labs: 11/01/2020: ALT 14; BUN 12; Creatinine, Ser 1.11; Hemoglobin 13.4; Platelets 246; Potassium 4.3; Sodium 142; TSH 0.933  Recent Lipid Panel    Component Value  Date/Time   CHOL 154 02/23/2021 1022   CHOL 211 (H) 11/01/2020 1405   TRIG 103 02/23/2021 1022   HDL 47 02/23/2021 1022   HDL 54 11/01/2020 1405   CHOLHDL 3.3 02/23/2021 1022   VLDL 21 02/23/2021 1022   LDLCALC 86 02/23/2021 1022   LDLCALC 133 (H) 11/01/2020 1405   LDLCALC 119 (H) 10/23/2017 1115     Risk Assessment/Calculations:      Physical Exam:    VS:  BP 136/70 (BP Location: Left Arm, Patient Position: Sitting, Cuff Size: Normal)   Pulse 71   Ht 5\' 2"  (1.575 m)   Wt 137 lb (62.1 kg)   SpO2 97%   BMI 25.06 kg/m     Wt Readings from Last 3 Encounters:  02/26/21 137 lb (62.1 kg)  01/08/21 136 lb 6 oz (61.9 kg)  12/05/20 136 lb (61.7 kg)     GEN:  Well nourished, well developed in no acute distress HEENT: Normal NECK: No JVD; No carotid bruits LYMPHATICS: No lymphadenopathy CARDIAC: RRR, no murmurs, rubs, gallops RESPIRATORY:  Clear to auscultation without rales, wheezing or rhonchi  ABDOMEN: Soft, non-tender, non-distended MUSCULOSKELETAL:  No edema; No deformity  SKIN: Warm and dry NEUROLOGIC:  Alert and oriented x 3 PSYCHIATRIC:  Normal affect   ASSESSMENT:    1. Coronary artery disease involving native coronary artery of native heart without angina pectoris   2. Pure hypercholesterolemia   3. Hyperlipidemia, unspecified hyperlipidemia type    PLAN:    In order of problems listed above:  1. Coronary artery calcification noted in the LAD and RCA on recent noncontrast chest CT.  She denies chest pain or shortness of breath.  Echocardiogram 12/2020 showed normal systolic and diastolic function, EF 55 to 60%.  Continue aspirin 81 mg, Lipitor 40 mg daily. 2. History of hyperlipidemia, goal LDL less than 70.  Continue Lipitor 40 mg daily.  Follow-up in 12 months     Medication Adjustments/Labs and Tests Ordered: Current medicines are reviewed at length with the patient today.  Concerns regarding medicines are outlined above.  Orders Placed This Encounter   Procedures  . Lipid panel   Meds ordered this encounter  Medications  . atorvastatin (LIPITOR) 40 MG tablet    Sig: Take 1 tablet (40 mg total) by mouth at bedtime.    Dispense:  90 tablet    Refill:  3    For future fills    Patient Instructions  Medication Instructions:  Your physician recommends that you continue on your current medications as directed. Please refer to the Current Medication list given to you today.  *If you need a refill on your cardiac medications before your next appointment, please call  your pharmacy*   Lab Work:  Your physician recommends that you return for a FASTING lipid profile: 6 months  - You will need to be fasting. Please do not have anything to eat or drink after midnight the morning you have the lab work. You may only have water or black coffee with no cream or sugar. - Please go to the [facility]. You will check in at the front desk to the right as you walk into the atrium. Valet Parking is offered if needed. - No appointment needed. You may go any day between 7 am and 6 pm.  Testing/Procedures: None ordered   Follow-Up: At [facility], you and your health needs are our priority.  As part of our continuing mission to provide you with exceptional heart care, we have created designated Provider Care Teams.  These Care Teams include your primary Cardiologist (physician) and Advanced Practice Providers (APPs -  Physician Assistants and Nurse Practitioners) who all work together to provide you with the care you need, when you need it.  We recommend signing up for the patient portal called "MyChart".  Sign up information is provided on this After Visit Summary.  MyChart is used to connect with patients for Virtual Visits (Telemedicine).  Patients are able to view lab/test results, encounter notes, upcoming appointments, etc.  Non-urgent messages can be sent to your provider as well.   To learn more about what you can do with MyChart, go to  [email or web address].    Your next appointment:   1 year(s)  The format for your next appointment:   In Person  Provider:   Debbe Odea, MD   Other Instructions      Signed, Debbe Odea, MD  02/26/2021 4:58 PM    [facility]

## 2021-02-26 NOTE — Patient Instructions (Signed)
Medication Instructions:  Your physician recommends that you continue on your current medications as directed. Please refer to the Current Medication list given to you today.  *If you need a refill on your cardiac medications before your next appointment, please call your pharmacy*   Lab Work:  Your physician recommends that you return for a FASTING lipid profile: 6 months  - You will need to be fasting. Please do not have anything to eat or drink after midnight the morning you have the lab work. You may only have water or black coffee with no cream or sugar. - Please go to the [facility]. You will check in at the front desk to the right as you walk into the atrium. Valet Parking is offered if needed. - No appointment needed. You may go any day between 7 am and 6 pm.  Testing/Procedures: None ordered   Follow-Up: At [facility], you and your health needs are our priority.  As part of our continuing mission to provide you with exceptional heart care, we have created designated Provider Care Teams.  These Care Teams include your primary Cardiologist (physician) and Advanced Practice Providers (APPs -  Physician Assistants and Nurse Practitioners) who all work together to provide you with the care you need, when you need it.  We recommend signing up for the patient portal called "MyChart".  Sign up information is provided on this After Visit Summary.  MyChart is used to connect with patients for Virtual Visits (Telemedicine).  Patients are able to view lab/test results, encounter notes, upcoming appointments, etc.  Non-urgent messages can be sent to your provider as well.   To learn more about what you can do with MyChart, go to [email or web address].    Your next appointment:   1 year(s)  The format for your next appointment:   In Person  Provider:   Debbe Odea, MD   Other Instructions

## 2021-08-28 ENCOUNTER — Other Ambulatory Visit
Admission: RE | Admit: 2021-08-28 | Discharge: 2021-08-28 | Disposition: A | Discharge status: Home / Self Care | Payer: Medicare PPO | Attending: Cardiology | Admitting: Cardiology

## 2021-08-28 DIAGNOSIS — E7800 Pure hypercholesterolemia, unspecified: Secondary | ICD-10-CM | POA: Insufficient documentation

## 2021-08-28 LAB — LIPID PANEL
Cholesterol: 185 mg/dL (ref 0–200)
HDL: 47 mg/dL (ref >40)
LDL Cholesterol: 102 mg/dL — ABNORMAL HIGH (ref 0–99)
Total CHOL/HDL Ratio: 3.9 RATIO
Triglycerides: 179 mg/dL — ABNORMAL HIGH (ref <150)
VLDL: 36 mg/dL (ref 0–40)

## 2021-09-04 ENCOUNTER — Telehealth: Payer: Self-pay

## 2021-09-04 DIAGNOSIS — E7800 Pure hypercholesterolemia, unspecified: Secondary | ICD-10-CM

## 2021-09-04 NOTE — Telephone Encounter (Signed)
-----   Message from Debbe Odea, MD sent at 08/29/2021  4:51 PM EDT ----- Cholesterol slightly elevated, continue Lipitor as prescribed, continue low-cholesterol diet.  Plan to repeat fasting lipid profile in 6 months.  If cholesterol still elevated at that point, will consider increasing dose of Lipitor.

## 2021-09-04 NOTE — Telephone Encounter (Signed)
Called patient and informed her of the result note as documented below. Order has been entered for the fasting Lipid panel to be drawn in 6 months. Patient has a recall in for her follow up in 01/2022. She requested that we schedule her lab appointment when we call her to schedule that follow up appointment. Will route to scheduling for an FYI.

## 2022-01-31 ENCOUNTER — Telehealth: Payer: Self-pay | Admitting: *Deleted

## 2022-01-31 NOTE — Telephone Encounter (Signed)
LMTC to schedule yearly Lung CA CT Scan. ?

## 2022-04-23 ENCOUNTER — Encounter: Payer: Self-pay | Admitting: Cardiology

## 2022-04-23 DIAGNOSIS — E785 Hyperlipidemia, unspecified: Secondary | ICD-10-CM

## 2022-04-23 MED ORDER — ATORVASTATIN CALCIUM 40 MG PO TABS: 40.0000 mg | ORAL_TABLET | Freq: Every day | ORAL | 0 refills | Status: DC

## 2022-05-01 ENCOUNTER — Telehealth: Payer: Self-pay | Admitting: *Deleted

## 2022-05-01 DIAGNOSIS — E7800 Pure hypercholesterolemia, unspecified: Secondary | ICD-10-CM

## 2022-05-01 NOTE — Telephone Encounter (Signed)
[address] lab looking at next weeks schedule.  This patient was down to come for lipid panel 6/15. She called pt to discuss and pt thought she would having completed at [facility]. Lab tech will call pt and tell her to go to [facility].  Will forward to nurse to address order (from October) as I am not sure order is for pt to have [facility].

## 2022-05-02 ENCOUNTER — Encounter: Payer: Self-pay | Admitting: Cardiology

## 2022-05-02 NOTE — Addendum Note (Signed)
Addended by: Kavin Leech on: 05/02/2022 08:50 AM   Modules accepted: Orders

## 2022-05-02 NOTE — Telephone Encounter (Signed)
Order updated to [facility].

## 2022-05-08 ENCOUNTER — Other Ambulatory Visit
Admission: RE | Admit: 2022-05-08 | Discharge: 2022-05-08 | Disposition: A | Discharge status: Home / Self Care | Payer: Medicare PPO | Attending: Cardiology | Admitting: Cardiology

## 2022-05-08 DIAGNOSIS — E7800 Pure hypercholesterolemia, unspecified: Secondary | ICD-10-CM | POA: Diagnosis present

## 2022-05-08 LAB — LIPID PANEL
Cholesterol: 149 mg/dL (ref 0–200)
HDL: 51 mg/dL (ref >40)
LDL Cholesterol: 82 mg/dL (ref 0–99)
Total CHOL/HDL Ratio: 2.9 RATIO
Triglycerides: 79 mg/dL (ref <150)
VLDL: 16 mg/dL (ref 0–40)

## 2022-05-09 ENCOUNTER — Other Ambulatory Visit: Payer: Medicare PPO

## 2022-05-16 ENCOUNTER — Encounter: Payer: Self-pay | Admitting: Cardiology

## 2022-05-16 ENCOUNTER — Ambulatory Visit: Payer: Medicare PPO | Admitting: Cardiology

## 2022-05-16 VITALS — BP 120/72 | HR 77 | Ht 62.0 in | Wt 134.0 lb

## 2022-05-16 DIAGNOSIS — E785 Hyperlipidemia, unspecified: Secondary | ICD-10-CM

## 2022-05-16 DIAGNOSIS — I2510 Atherosclerotic heart disease of native coronary artery without angina pectoris: Secondary | ICD-10-CM

## 2022-05-16 MED ORDER — ATORVASTATIN CALCIUM 40 MG PO TABS: 40.0000 mg | ORAL_TABLET | Freq: Every day | ORAL | 2 refills | Status: DC

## 2022-05-16 NOTE — Progress Notes (Signed)
Cardiology Office Note:    Date:  05/16/2022   ID:  Jaleia, Hanke 1956-03-01, MRN [id number]  PCP:  Charolotte Eke, FNP   [facility]  Cardiologist:  Debbe Odea, MD  Advanced Practice Provider:  No care team member to display Electrophysiologist:  None    Referring MD: No ref. provider found   Chief Complaint  Patient presents with   Follow-up    12 month F/U-No new cardiac concerns     History of Present Illness:    Brittany Reese is a 66 y.o. female with a hx of CAD (Coronary calcification in LAD, RCA), hyperlipidemia, former smoker x20+ years who presents for follow-up.  Being seen due to CAD/coronary calcifications.  Previous echocardiogram showed normal systolic function.  Started on aspirin, Lipitor.  Tolerating Lipitor without any adverse effects.  Denies chest pain.  Blood pressure has been normal.   Prior notes.  Echocardiogram 12/2020 EF 55 to 60% Family history of MI in her mother around age 20s to 58s.  States having a little bit of anxiety but otherwise doing okay.   Past Medical History:  Diagnosis Date   Depression    Hyperlipidemia    Thyroid disease     History reviewed. No pertinent surgical history.  Current Medications: Current Meds  Medication Sig   aspirin EC 81 MG tablet Take 1 tablet (81 mg total) by mouth daily. Swallow whole.   clobetasol ointment (TEMOVATE) 0.05 % Apply 1 application topically at bedtime.   fluocinonide-emollient (LIDEX-E) 0.05 % cream Apply 1 application topically 2 (two) times daily.   FLUoxetine (PROZAC) 20 MG capsule Take 1 capsule (20 mg total) by mouth daily.   levothyroxine (SYNTHROID) 75 MCG tablet Take 1 tablet (75 mcg total) by mouth daily before breakfast.   zolpidem (AMBIEN) 10 MG tablet TAKE 1/2 TO 1 (ONE-HALF TO ONE) TABLET BY MOUTH AT BEDTIME AS NEEDED FOR SLEEP   [DISCONTINUED] atorvastatin (LIPITOR) 40 MG tablet Take 1 tablet (40 mg total) by mouth at bedtime.      Allergies:   Patient has no known allergies.   Social History   Socioeconomic History   Marital status: Married    Spouse name: Not on file   Number of children: Not on file   Years of education: Not on file   Highest education level: Not on file  Occupational History   Not on file  Tobacco Use   Smoking status: Former    Packs/day: 1.00    Years: 41.00    Total pack years: 41.00    Types: Cigarettes    Quit date: 10/18/2014    Years since quitting: 7.5   Smokeless tobacco: Never  Vaping Use   Vaping Use: Never used  Substance and Sexual Activity   Alcohol use: Yes    Alcohol/week: 3.0 standard drinks of alcohol    Types: 3 Cans of beer per week    Comment: occasional   Drug use: No   Sexual activity: Never    Birth control/protection: None  Other Topics Concern   Not on file  Social History Narrative   Not on file   Social Determinants of Health   Financial Resource Strain: Not on file  Food Insecurity: Not on file  Transportation Needs: Not on file  Physical Activity: Not on file  Stress: Not on file  Social Connections: Not on file     Family History: The patient's family history includes COPD in her mother;  Healthy in her brother and brother; Heart disease in her mother; Hypertension in her mother; Kidney disease in her father. There is no history of Breast cancer.  ROS:   Please see the history of present illness.     All other systems reviewed and are negative.  EKGs/Labs/Other Studies Reviewed:    The following studies were reviewed today:   EKG:  EKG not ordered today.   Recent Labs: No results found for requested labs within last 365 days.  Recent Lipid Panel    Component Value Date/Time   CHOL 149 05/08/2022 1024   CHOL 211 (H) 11/01/2020 1405   TRIG 79 05/08/2022 1024   HDL 51 05/08/2022 1024   HDL 54 11/01/2020 1405   CHOLHDL 2.9 05/08/2022 1024   VLDL 16 05/08/2022 1024   LDLCALC 82 05/08/2022 1024   LDLCALC 133 (H)  11/01/2020 1405   LDLCALC 119 (H) 10/23/2017 1115     Risk Assessment/Calculations:      Physical Exam:    VS:  BP 120/72 (BP Location: Left Arm, Patient Position: Sitting, Cuff Size: Normal)   Pulse 77   Ht 5\' 2"  (1.575 m)   Wt 134 lb (60.8 kg)   SpO2 98%   BMI 24.51 kg/m     Wt Readings from Last 3 Encounters:  05/16/22 134 lb (60.8 kg)  02/26/21 137 lb (62.1 kg)  01/08/21 136 lb 6 oz (61.9 kg)     GEN:  Well nourished, well developed in no acute distress HEENT: Normal NECK: No JVD; No carotid bruits LYMPHATICS: No lymphadenopathy CARDIAC: RRR, no murmurs, rubs, gallops RESPIRATORY:  Clear to auscultation without rales, wheezing or rhonchi  ABDOMEN: Soft, non-tender, non-distended MUSCULOSKELETAL:  No edema; No deformity  SKIN: Warm and dry NEUROLOGIC:  Alert and oriented x 3 PSYCHIATRIC:  Normal affect   ASSESSMENT:    1. Coronary artery disease involving native coronary artery of native heart without angina pectoris   2. Hyperlipidemia, unspecified hyperlipidemia type    PLAN:    In order of problems listed above:  CAD/coronary artery calcification noted in the LAD and RCA chest CT.  Denies chest pain.  Echo with normal EF.  Continue aspirin 81 mg, Lipitor 40 mg  Hyperlipidemia, cholesterol controlled, continue Lipitor 40 mg daily.  Follow-up in 12 months     Medication Adjustments/Labs and Tests Ordered: Current medicines are reviewed at length with the patient today.  Concerns regarding medicines are outlined above.  Orders Placed This Encounter  Procedures   EKG 12-Lead   Meds ordered this encounter  Medications   atorvastatin (LIPITOR) 40 MG tablet    Sig: Take 1 tablet (40 mg total) by mouth at bedtime.    Dispense:  90 tablet    Refill:  2    For future fills    Patient Instructions  Medication Instructions:  Your physician recommends that you continue on your current medications as directed. Please refer to the Current Medication list  given to you today.  *If you need a refill on your cardiac medications before your next appointment, please call your pharmacy*   Lab Work: None ordered  If you have labs (blood work) drawn today and your tests are completely normal, you will receive your results only by: MyChart Message (if you have MyChart) OR A paper copy in the mail If you have any lab test that is abnormal or we need to change your treatment, we will call you to review the results.   Testing/Procedures:  None ordered  Follow-Up: At [facility], you and your health needs are our priority.  As part of our continuing mission to provide you with exceptional heart care, we have created designated Provider Care Teams.  These Care Teams include your primary Cardiologist (physician) and Advanced Practice Providers (APPs -  Physician Assistants and Nurse Practitioners) who all work together to provide you with the care you need, when you need it.  We recommend signing up for the patient portal called "MyChart".  Sign up information is provided on this After Visit Summary.  MyChart is used to connect with patients for Virtual Visits (Telemedicine).  Patients are able to view lab/test results, encounter notes, upcoming appointments, etc.  Non-urgent messages can be sent to your provider as well.   To learn more about what you can do with MyChart, go to [email or web address].    Your next appointment:   1 year(s)  The format for your next appointment:   In Person  Provider:   You may see Debbe Odea, MD or one of the following Advanced Practice Providers on your designated Care Team:   Nicolasa Ducking, NP Eula Listen, PA-C Cadence Fransico Michael, [place]   Important Information About Sugar         Signed, Debbe Odea, MD  05/16/2022 12:59 PM     [facility]

## 2022-10-01 IMAGING — CT CT CHEST LUNG CANCER SCREENING LOW DOSE W/O CM
2 of 5 series · 15 of 40 positions shown, 18 images · non-contrast
Comparison: None.

CLINICAL DATA: 64-year-old asymptomatic female former smoker with
41 pack-year smoking history, quit smoking 7 years prior.

EXAM:
CT CHEST WITHOUT CONTRAST LOW-DOSE FOR LUNG CANCER SCREENING
TECHNIQUE: Multidetector CT imaging of the chest was performed following the
standard protocol without IV contrast.

[Series 3: lung 1.00 · axial · 0.70mm/px · z∈[-1204,-926]mm · 12 of 306 slices shown, 15 images]
[im 14/306  mediastinal]
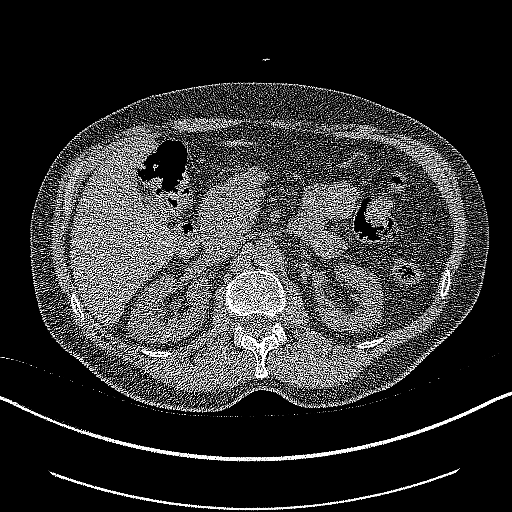
[im 14/306  lung]
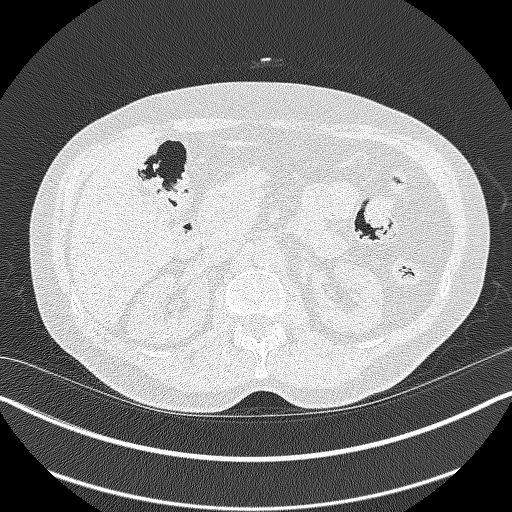
[im 42/306  lung]
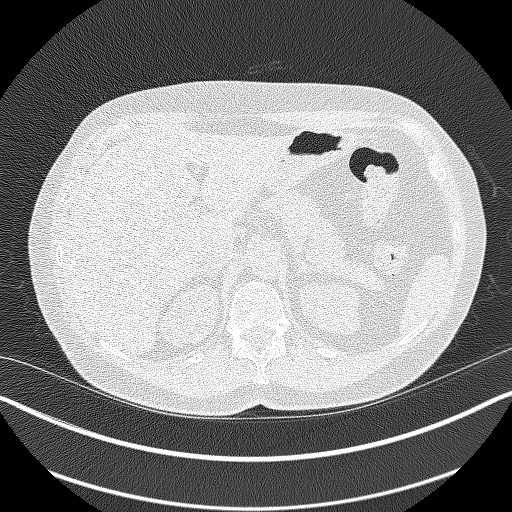
[im 70/306  lung]
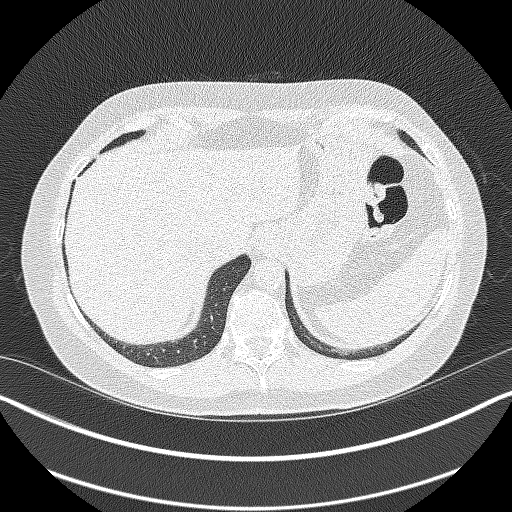
[im 98/306  lung]
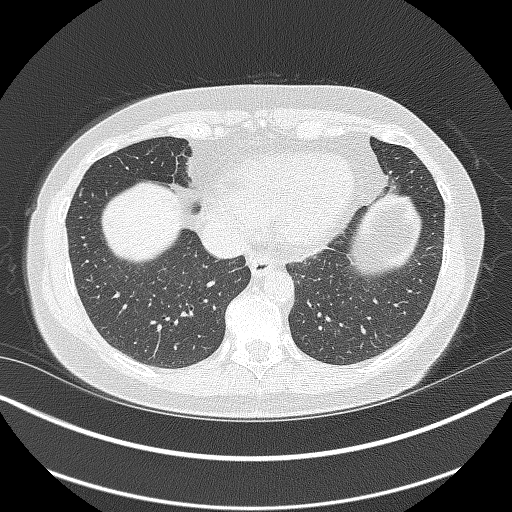
[im 111/306  mediastinal]
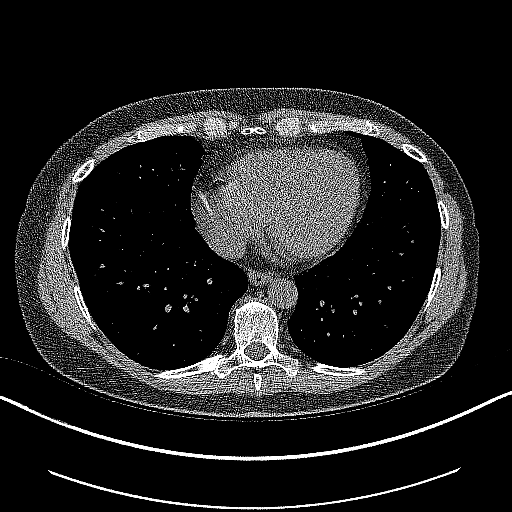
[im 111/306  lung]
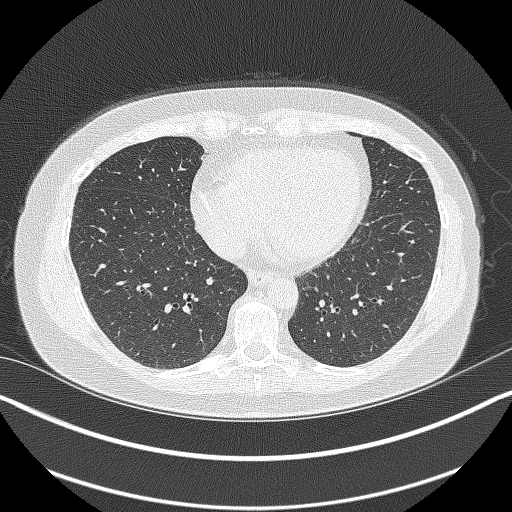
[im 139/306  lung]
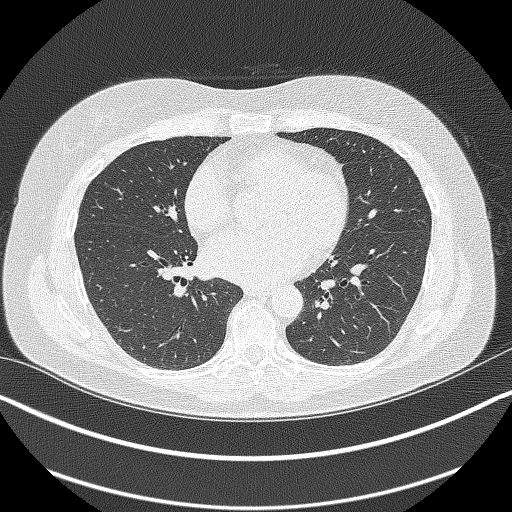
[im 167/306  lung]
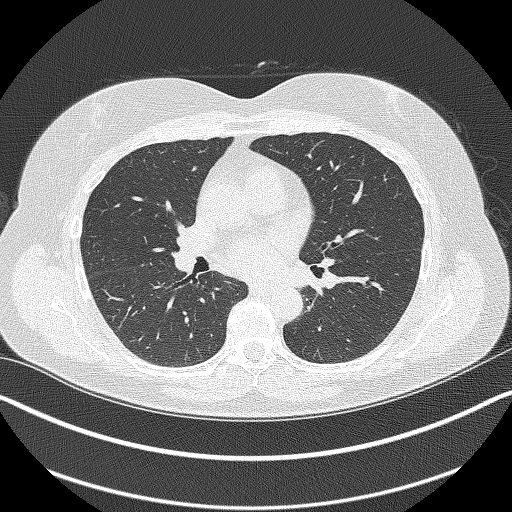
[im 195/306  lung]
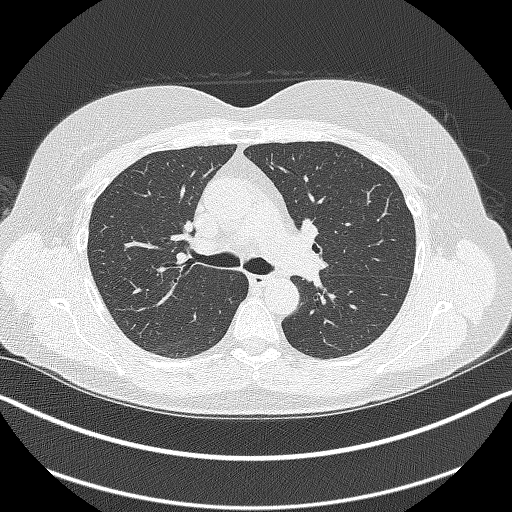
[im 208/306  mediastinal]
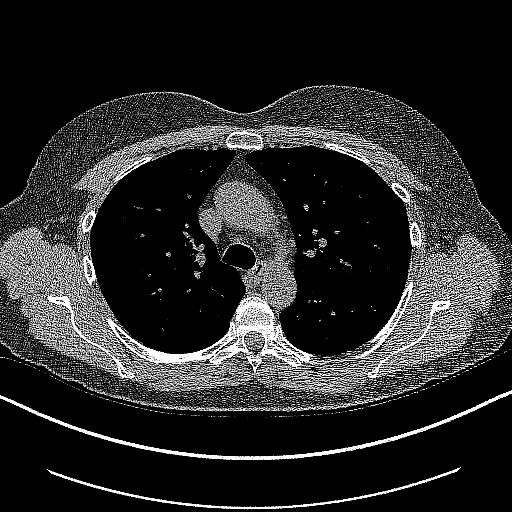
[im 208/306  lung]
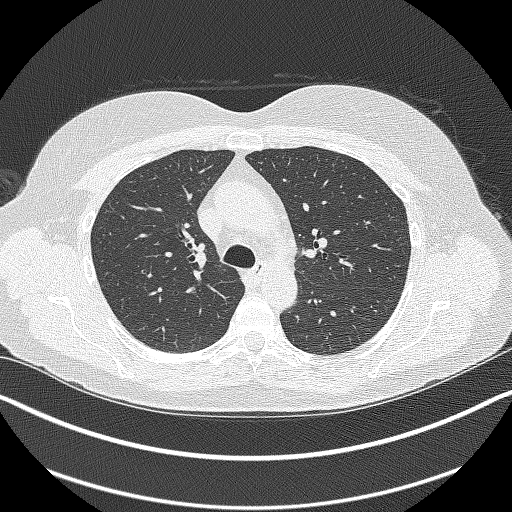
[im 236/306  lung]
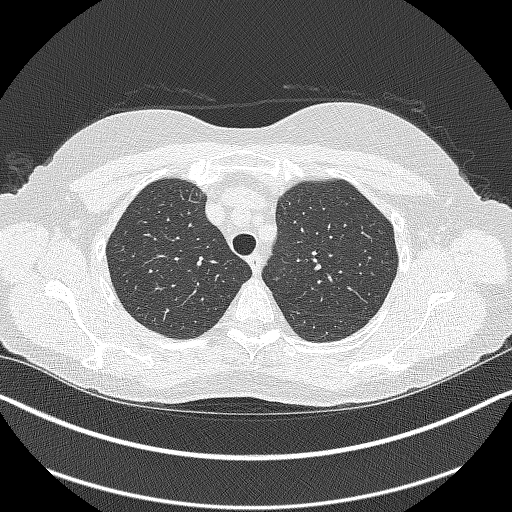
[im 264/306  lung]
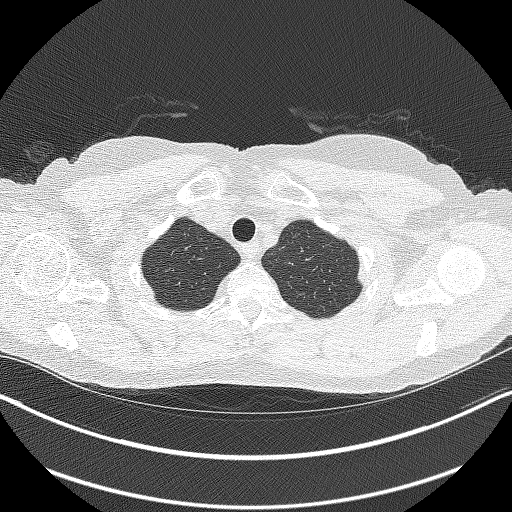
[im 292/306  lung]
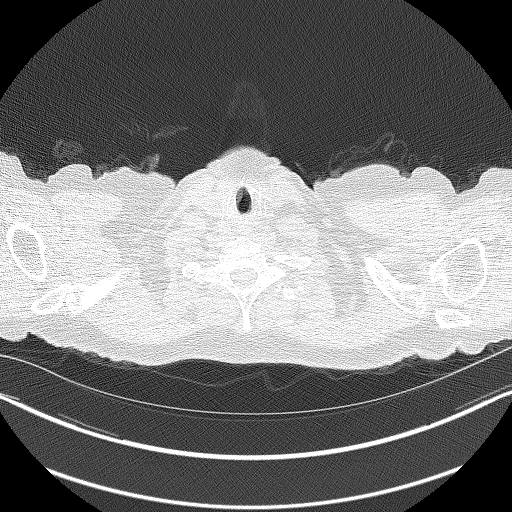

[Series 4: coronals lung 1.00 cor · coronal · 0.60mm/px · 3 of 315 slices shown]
[im 63/315  lung]
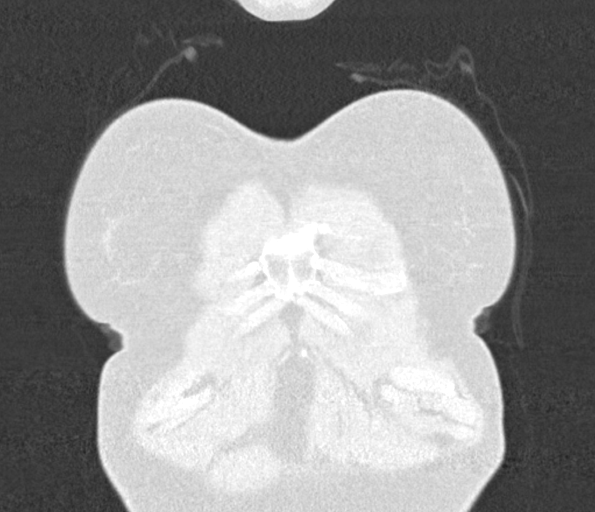
[im 126/315  lung]
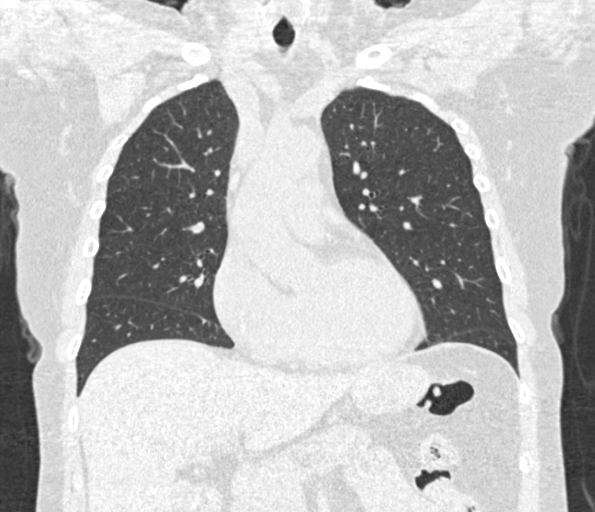
[im 189/315  lung]
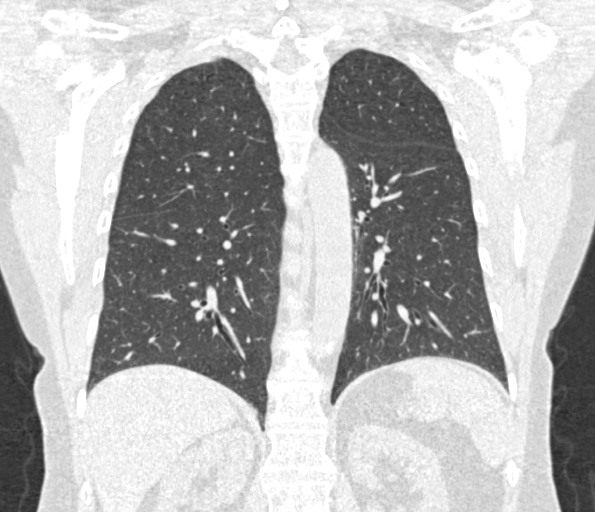

[15 of 40 positions shown; findings below may reference images not displayed]

FINDINGS: Cardiovascular: Normal heart size. No significant pericardial
effusion/thickening. Left anterior descending and right coronary
atherosclerosis. Atherosclerotic nonaneurysmal thoracic aorta.
Normal caliber pulmonary arteries.

Mediastinum/Nodes: No discrete thyroid nodules. Unremarkable
esophagus. No pathologically enlarged axillary, mediastinal or hilar
lymph nodes, noting limited sensitivity for the detection of hilar
adenopathy on this noncontrast study.

Lungs/Pleura: No pneumothorax. No pleural effusion. Mild
centrilobular emphysema. No acute consolidative airspace disease or
lung masses. Peripheral left upper lobe subpleural solid tiny
pulmonary nodule measuring 2.7 mm in volume derived mean diameter
(series 3/image 181). No additional significant pulmonary nodules.

Upper abdomen: No acute abnormality.

Musculoskeletal: No aggressive appearing focal osseous lesions. Mild
thoracic spondylosis.
IMPRESSION: 1. Lung-RADS 2, benign appearance or behavior. Continue annual
screening with low-dose chest CT without contrast in 12 months.
2. Two-vessel coronary atherosclerosis.
3. Aortic Atherosclerosis (J6UDS-N6Q.Q) and Emphysema (J6UDS-ITQ.2).

## 2022-11-29 ENCOUNTER — Encounter (INDEPENDENT_AMBULATORY_CARE_PROVIDER_SITE_OTHER): Payer: Medicare PPO | Admitting: Cardiology

## 2022-11-29 DIAGNOSIS — I2510 Atherosclerotic heart disease of native coronary artery without angina pectoris: Secondary | ICD-10-CM | POA: Diagnosis not present

## 2022-11-29 NOTE — Telephone Encounter (Signed)
[facility] Outside Information   CT Lung Cancer Screening Baseline or Annual Low Dose  Anatomical Region Laterality Modality  Lung -- Computed Tomography  Chest -- --   Impression   Lung RADS-1S No nodules or other suspicious findings for lung cancer.  The S modifier is used in this case due to the presence of significant coronary calcification for a female of this age.  Recommendations:  Continue annual screening. Narrative  EXAM: CT LUNG CANCER SCREENING BASELINE OR ANNUAL LOW DOSE DATE: 11/28/2022 1:52 PM ACCESSION: [id number] [place] DICTATED: 11/28/2022 2:03 PM INTERPRETATION LOCATION: MAIN CAMPUS  CLINICAL INDICATION: 67 year old female for baseline lung cancer screening.  Current or fomer smoker, pack year history, quit date if applicable)  COMPARISON: None  TECHNIQUE: Contiguous 0.6 or 1 mm and 2 mm axial images were reconstructed through the chest following a single breath hold helical acquisition.  Images were reformatted in the axial and sagittal planes. The exam was performed with low radiation dose. MIP slabs were also constructed.  CTDI:  0.58   DLP:   17.7   FINDINGS:  LUNGS AND AIRWAYS:  The lungs are clear.  The central airways are patent.  EMPHYSEMA:  None.  CORONARY CALCIFICATION: LAD:  Extensive. LCX:  Moderate. RCA:  Moderate.   OTHER: No other significant findings. Procedure Note  Lajuan Lines, MD - 11/28/2022 Formatting of this note might be different from the original. EXAM: CT LUNG CANCER SCREENING BASELINE OR ANNUAL LOW DOSE DATE: 11/28/2022 1:52 PM ACCESSION: [id number] [place] DICTATED: 11/28/2022 2:03 PM INTERPRETATION LOCATION: MAIN CAMPUS  CLINICAL INDICATION: 67 year old female for baseline lung cancer screening.  Current or fomer smoker, pack year history, quit date if applicable)  COMPARISON: None  TECHNIQUE: Contiguous 0.6 or 1 mm and 2 mm axial images were reconstructed through the chest following a single breath hold  helical acquisition.  Images were reformatted in the axial and sagittal planes. The exam was performed with low radiation dose. MIP slabs were also constructed.  CTDI:  0.58   DLP:   17.7   FINDINGS:  LUNGS AND AIRWAYS:  The lungs are clear.  The central airways are patent.  EMPHYSEMA:  None.  CORONARY CALCIFICATION: LAD:  Extensive. LCX:  Moderate. RCA:  Moderate.   OTHER: No other significant findings.   IMPRESSION:  Lung RADS-1S No nodules or other suspicious findings for lung cancer.  The S modifier is used in this case due to the presence of significant coronary calcification for a female of this age.  Recommendations:  Continue annual screening. Exam End: 11/28/22 13:52   Specimen Collected: 11/28/22 14:03 Last Resulted: 11/28/22 14:09  Received From: [place]  Result Received: 11/29/22 16:32

## 2022-11-29 NOTE — Telephone Encounter (Signed)
Please see the MyChart message reply(ies) for my assessment and plan.    This patient gave consent for this Medical Advice Message and is aware that it may result in a bill to their insurance company, as well as the possibility of receiving a bill for a co-payment or deductible. They are an established patient, but are not seeking medical advice exclusively about a problem treated during an in person or video visit in the last seven days. I did not recommend an in person or video visit within seven days of my reply.    I spent a total of 12 minutes cumulative time within 7 days through MyChart messaging.  Thomasine Klutts Agbor-Etang, MD   

## 2023-03-03 ENCOUNTER — Encounter: Payer: Self-pay | Admitting: Cardiology

## 2023-03-03 ENCOUNTER — Ambulatory Visit: Payer: Medicare PPO | Attending: Cardiology | Admitting: Cardiology

## 2023-03-03 VITALS — BP 142/72 | HR 73 | Ht 62.0 in | Wt 138.0 lb

## 2023-03-03 DIAGNOSIS — E785 Hyperlipidemia, unspecified: Secondary | ICD-10-CM

## 2023-03-03 DIAGNOSIS — I2510 Atherosclerotic heart disease of native coronary artery without angina pectoris: Secondary | ICD-10-CM | POA: Diagnosis not present

## 2023-03-03 MED ORDER — ATORVASTATIN CALCIUM 40 MG PO TABS
40.0000 mg | ORAL_TABLET | Freq: Every day | ORAL | 2 refills | Status: DC
Start: 2023-03-03 — End: 2023-03-03

## 2023-03-03 MED ORDER — METOPROLOL TARTRATE 100 MG PO TABS: 100.0000 mg | ORAL_TABLET | Freq: Once | ORAL | 0 refills | Status: DC

## 2023-03-03 MED ORDER — IVABRADINE HCL 5 MG PO TABS: 10.0000 mg | ORAL_TABLET | Freq: Once | ORAL | 0 refills | Status: AC

## 2023-03-03 MED ORDER — ATORVASTATIN CALCIUM 40 MG PO TABS: 40.0000 mg | ORAL_TABLET | Freq: Every day | ORAL | 2 refills | Status: DC

## 2023-03-03 NOTE — Patient Instructions (Signed)
Medication Instructions:   - Make sure you are taking your:  Atorvastatin - take one tablet ( 40mg ) by mouth daily.  Aspirin - take one tablet ( 81mg ) by mouth daily.     *If you need a refill on your cardiac medications before your next appointment, please call your pharmacy*   Lab Work:  None Ordered  If you have labs (blood work) drawn today and your tests are completely normal, you will receive your results only by: MyChart Message (if you have MyChart) OR A paper copy in the mail If you have any lab test that is abnormal or we need to change your treatment, we will call you to review the results.   Testing/Procedures:  Your physician has requested that you have an echocardiogram. Echocardiography is a painless test that uses sound waves to create images of your heart. It provides your doctor with information about the size and shape of your heart and how well your heart's chambers and valves are working. This procedure takes approximately one hour. There are no restrictions for this procedure. Please do NOT wear cologne, perfume, aftershave, or lotions (deodorant is allowed). Please arrive 15 minutes prior to your appointment time.    Your cardiac CT will be scheduled on April 18th, 2024 @ 1230pm  [facility] [address] [place], [place] [address] [phone]  If scheduled at [facility] or [facility], please arrive 15 mins early for check-in and test prep.  Please follow these instructions carefully (unless otherwise directed):   On the Night Before the Test: Be sure to Drink plenty of water. Do not consume any caffeinated/decaffeinated beverages or chocolate 12 hours prior to your test. Do not take any antihistamines 12 hours prior to your test.  On the Day of the Test: Drink plenty of water until 1 hour prior to the test. Do not eat any food 1 hour prior to  test. You may take your regular medications prior to the test.  Take metoprolol (Lopressor) two hours prior to test. Take corlanor two hours prior to test FEMALES- please wear underwire-free bra if available, avoid dresses & tight clothing       After the Test: Drink plenty of water. After receiving IV contrast, you may experience a mild flushed feeling. This is normal. On occasion, you may experience a mild rash up to 24 hours after the test. This is not dangerous. If this occurs, you can take Benadryl 25 mg and increase your fluid intake. If you experience trouble breathing, this can be serious. If it is severe call 911 IMMEDIATELY. If it is mild, please call our office.   Follow-Up: At [facility], you and your health needs are our priority.  As part of our continuing mission to provide you with exceptional heart care, we have created designated Provider Care Teams.  These Care Teams include your primary Cardiologist (physician) and Advanced Practice Providers (APPs -  Physician Assistants and Nurse Practitioners) who all work together to provide you with the care you need, when you need it.  We recommend signing up for the patient portal called "MyChart".  Sign up information is provided on this After Visit Summary.  MyChart is used to connect with patients for Virtual Visits (Telemedicine).  Patients are able to view lab/test results, encounter notes, upcoming appointments, etc.  Non-urgent messages can be sent to your provider as well.   To learn more about what you can  do with MyChart, go to [email or web address].    Your next appointment:   2 month(s)  Provider:   You may see Debbe Odea, MD or one of the following Advanced Practice Providers on your designated Care Team:   Nicolasa Ducking, NP Eula Listen, PA-C Cadence Fransico Michael, PA-C Charlsie Quest, NP

## 2023-03-03 NOTE — Progress Notes (Signed)
Cardiology Office Note:    Date:  03/03/2023   ID:  Brittany Reese, DOB 1956-03-02, MRN [id number]  PCP:  Charolotte Eke, FNP   [facility]  Cardiologist:  Debbe Odea, MD  Advanced Practice Provider:  No care team member to display Electrophysiologist:  None    Referring MD: Charolotte Eke, FNP   Chief Complaint  Patient presents with   Follow-up    Discuss 11/28/22 CT obtained at [facility]   History of Present Illness:    Brittany Reese is a 67 y.o. female with a hx of CAD (Coronary calcification in LAD, RCA), hyperlipidemia, former smoker x20+ years who presents for follow-up.  Recently seen at [facility], chest CT lung cancer screening 11/28/2022 showed LAD, left circumflex, RCA calcifications.  Prior chest CT 11/2020 showed LAD and RCA calcifications.  Coronary calcifications seems to have worsened compared to prior.  Denies chest pain, denies more than ordinary shortness of breath.  She is worried since her mother had a heart attack in her 63s, needing bypass.  Prior notes.  Chest CT 11/2020 LAD, RCA calcifications Echocardiogram 12/2020 EF 55 to 60% Family history of MI in her mother around age 46s to 68s.  States having a little bit of anxiety but otherwise doing okay.   Past Medical History:  Diagnosis Date   Depression    Hyperlipidemia    Thyroid disease     History reviewed. No pertinent surgical history.  Current Medications: Current Meds  Medication Sig   aspirin EC 81 MG tablet Take 1 tablet (81 mg total) by mouth daily. Swallow whole.   clobetasol ointment (TEMOVATE) 0.05 % Apply 1 application topically at bedtime.   fluocinonide-emollient (LIDEX-E) 0.05 % cream Apply 1 application topically 2 (two) times daily.   FLUoxetine (PROZAC) 20 MG capsule Take 1 capsule (20 mg total) by mouth daily.   ivabradine (CORLANOR) 5 MG TABS tablet Take 2 tablets (10 mg total) by mouth once for 1 dose. TWO HOURS PRIOR TO TEST   levothyroxine  (SYNTHROID) 75 MCG tablet Take 1 tablet (75 mcg total) by mouth daily before breakfast.   metoprolol tartrate (LOPRESSOR) 100 MG tablet Take 1 tablet (100 mg total) by mouth once for 1 dose. TWP HOURS PRIOR TO TEST   zolpidem (AMBIEN) 10 MG tablet TAKE 1/2 TO 1 (ONE-HALF TO ONE) TABLET BY MOUTH AT BEDTIME AS NEEDED FOR SLEEP   [DISCONTINUED] atorvastatin (LIPITOR) 40 MG tablet Take 1 tablet (40 mg total) by mouth at bedtime.     Allergies:   Patient has no known allergies.   Social History   Socioeconomic History   Marital status: Married    Spouse name: Not on file   Number of children: Not on file   Years of education: Not on file   Highest education level: Not on file  Occupational History   Not on file  Tobacco Use   Smoking status: Former    Packs/day: 1.00    Years: 41.00    Additional pack years: 0.00    Total pack years: 41.00    Types: Cigarettes    Quit date: 10/18/2014    Years since quitting: 8.3   Smokeless tobacco: Never  Vaping Use   Vaping Use: Never used  Substance and Sexual Activity   Alcohol use: Yes    Alcohol/week: 3.0 standard drinks of alcohol    Types: 3 Cans of beer per week    Comment: occasional   Drug use:  No   Sexual activity: Never    Birth control/protection: None  Other Topics Concern   Not on file  Social History Narrative   Not on file   Social Determinants of Health   Financial Resource Strain: Not on file  Food Insecurity: Not on file  Transportation Needs: Not on file  Physical Activity: Not on file  Stress: Not on file  Social Connections: Not on file     Family History: The patient's family history includes COPD in her mother; Healthy in her brother and brother; Heart disease in her mother; Hypertension in her mother; Kidney disease in her father. There is no history of Breast cancer.  ROS:   Please see the history of present illness.     All other systems reviewed and are negative.  EKGs/Labs/Other Studies Reviewed:     The following studies were reviewed today:   EKG:  EKG not ordered today.   Recent Labs: No results found for requested labs within last 365 days.  Recent Lipid Panel    Component Value Date/Time   CHOL 149 05/08/2022 1024   CHOL 211 (H) 11/01/2020 1405   TRIG 79 05/08/2022 1024   HDL 51 05/08/2022 1024   HDL 54 11/01/2020 1405   CHOLHDL 2.9 05/08/2022 1024   VLDL 16 05/08/2022 1024   LDLCALC 82 05/08/2022 1024   LDLCALC 133 (H) 11/01/2020 1405   LDLCALC 119 (H) 10/23/2017 1115     Risk Assessment/Calculations:      Physical Exam:    VS:  BP (!) 142/72 (BP Location: Left Arm, Patient Position: Sitting)   Pulse 73   Ht 5\' 2"  (1.575 m)   Wt 138 lb (62.6 kg)   SpO2 98%   BMI 25.24 kg/m     Wt Readings from Last 3 Encounters:  03/03/23 138 lb (62.6 kg)  05/16/22 134 lb (60.8 kg)  02/26/21 137 lb (62.1 kg)     GEN:  Well nourished, well developed in no acute distress HEENT: Normal NECK: No JVD; No carotid bruits CARDIAC: RRR, no murmurs, rubs, gallops RESPIRATORY:  Clear to auscultation without rales, wheezing or rhonchi  ABDOMEN: Soft, non-tender, non-distended MUSCULOSKELETAL:  No edema; No deformity  SKIN: Warm and dry NEUROLOGIC:  Alert and oriented x 3 PSYCHIATRIC:  Normal affect   ASSESSMENT:    1. Coronary artery disease involving native coronary artery of native heart without angina pectoris   2. Hyperlipidemia, unspecified hyperlipidemia type   3. ASCVD (arteriosclerotic cardiovascular disease)    PLAN:    In order of problems listed above:  CAD/coronary artery calcification noted in the LAD, left circumflex and RCA chest CT. atherosclerotic disease seems to have worsened compared to prior.  Repeat echo, obtain coronary CTA.  Strong family history of CAD/CABG. Continue aspirin 81 mg, Lipitor 40 mg  Hyperlipidemia, cholesterol controlled, continue Lipitor 40 mg daily.  Follow-up in 2 months   Medication Adjustments/Labs and Tests  Ordered: Current medicines are reviewed at length with the patient today.  Concerns regarding medicines are outlined above.  Orders Placed This Encounter  Procedures   CT CORONARY MORPH W/CTA COR W/SCORE W/CA W/CM &/OR WO/CM   EKG 12-Lead   ECHOCARDIOGRAM COMPLETE   Meds ordered this encounter  Medications   DISCONTD: atorvastatin (LIPITOR) 40 MG tablet    Sig: Take 1 tablet (40 mg total) by mouth at bedtime.    Dispense:  90 tablet    Refill:  2    For future fills  atorvastatin (LIPITOR) 40 MG tablet    Sig: Take 1 tablet (40 mg total) by mouth at bedtime.    Dispense:  90 tablet    Refill:  2    For future fills   metoprolol tartrate (LOPRESSOR) 100 MG tablet    Sig: Take 1 tablet (100 mg total) by mouth once for 1 dose. TWP HOURS PRIOR TO TEST    Dispense:  1 tablet    Refill:  0   ivabradine (CORLANOR) 5 MG TABS tablet    Sig: Take 2 tablets (10 mg total) by mouth once for 1 dose. TWO HOURS PRIOR TO TEST    Dispense:  2 tablet    Refill:  0    Patient Instructions  Medication Instructions:   - Make sure you are taking your:  Atorvastatin - take one tablet ( 40mg ) by mouth daily.  Aspirin - take one tablet ( 81mg ) by mouth daily.     *If you need a refill on your cardiac medications before your next appointment, please call your pharmacy*   Lab Work:  None Ordered  If you have labs (blood work) drawn today and your tests are completely normal, you will receive your results only by: MyChart Message (if you have MyChart) OR A paper copy in the mail If you have any lab test that is abnormal or we need to change your treatment, we will call you to review the results.   Testing/Procedures:  Your physician has requested that you have an echocardiogram. Echocardiography is a painless test that uses sound waves to create images of your heart. It provides your doctor with information about the size and shape of your heart and how well your heart's chambers and valves  are working. This procedure takes approximately one hour. There are no restrictions for this procedure. Please do NOT wear cologne, perfume, aftershave, or lotions (deodorant is allowed). Please arrive 15 minutes prior to your appointment time.    Your cardiac CT will be scheduled on April 18th, 2024 @ 1230pm  [facility] [address] [place], [place] [address] [phone]  If scheduled at [facility] or [facility], please arrive 15 mins early for check-in and test prep.  Please follow these instructions carefully (unless otherwise directed):   On the Night Before the Test: Be sure to Drink plenty of water. Do not consume any caffeinated/decaffeinated beverages or chocolate 12 hours prior to your test. Do not take any antihistamines 12 hours prior to your test.  On the Day of the Test: Drink plenty of water until 1 hour prior to the test. Do not eat any food 1 hour prior to test. You may take your regular medications prior to the test.  Take metoprolol (Lopressor) two hours prior to test. Take corlanor two hours prior to test FEMALES- please wear underwire-free bra if available, avoid dresses & tight clothing       After the Test: Drink plenty of water. After receiving IV contrast, you may experience a mild flushed feeling. This is normal. On occasion, you may experience a mild rash up to 24 hours after the test. This is not dangerous. If this occurs, you can take Benadryl 25 mg and increase your fluid intake. If you experience trouble breathing, this can be serious. If it is severe call 911 IMMEDIATELY. If it is mild, please call our office.   Follow-Up: At [facility], you and your health needs  are our priority.  As part of our continuing mission to provide you with exceptional heart care, we have created designated Provider Care Teams.  These Care Teams include your  primary Cardiologist (physician) and Advanced Practice Providers (APPs -  Physician Assistants and Nurse Practitioners) who all work together to provide you with the care you need, when you need it.  We recommend signing up for the patient portal called "MyChart".  Sign up information is provided on this After Visit Summary.  MyChart is used to connect with patients for Virtual Visits (Telemedicine).  Patients are able to view lab/test results, encounter notes, upcoming appointments, etc.  Non-urgent messages can be sent to your provider as well.   To learn more about what you can do with MyChart, go to [email or web address].    Your next appointment:   2 month(s)  Provider:   You may see Debbe OdeaBrian Agbor-Etang, MD or one of the following Advanced Practice Providers on your designated Care Team:   Nicolasa Duckinghristopher Berge, NP Eula Listenyan Dunn, PA-C Cadence Fransico MichaelFurth, PA-C Charlsie QuestSheri Hammock, NP    Signed, Debbe OdeaBrian Agbor-Etang, MD  03/03/2023 3:08 PM    [facility]

## 2023-03-04 ENCOUNTER — Other Ambulatory Visit: Payer: Self-pay

## 2023-03-04 MED ORDER — IVABRADINE HCL 5 MG PO TABS: 10.0000 mg | ORAL_TABLET | Freq: Once | ORAL | 0 refills | Status: AC

## 2023-03-11 ENCOUNTER — Telehealth (HOSPITAL_COMMUNITY): Payer: Self-pay | Admitting: Emergency Medicine

## 2023-03-11 NOTE — Telephone Encounter (Signed)
Reaching out to patient to offer assistance regarding upcoming cardiac imaging study; pt verbalizes understanding of appt date/time, parking situation and where to check in, pre-test NPO status and medications ordered, and verified current allergies; name and call back number provided for further questions should they arise Rockwell Alexandria RN Navigator Cardiac Imaging [facility] [phone] office [phone] cell  Arrival 1215 [facility]  metoprolol +  ivabradine Denies iv issues

## 2023-03-13 ENCOUNTER — Ambulatory Visit
Admission: RE | Admit: 2023-03-13 | Discharge: 2023-03-13 | Disposition: A | Discharge status: Home / Self Care | Payer: Medicare PPO | Source: Ambulatory Visit | Attending: Cardiology | Admitting: Cardiology

## 2023-03-13 DIAGNOSIS — R9430 Abnormal result of cardiovascular function study, unspecified: Secondary | ICD-10-CM

## 2023-03-13 DIAGNOSIS — I2510 Atherosclerotic heart disease of native coronary artery without angina pectoris: Secondary | ICD-10-CM | POA: Diagnosis not present

## 2023-03-13 MED ORDER — IOHEXOL 350 MG/ML SOLN
75.0000 mL | Freq: Once | INTRAVENOUS | Status: AC | PRN
  Administered 2023-03-13: 75 mL via INTRAVENOUS

## 2023-03-13 MED ORDER — NITROGLYCERIN 0.4 MG SL SUBL
0.8000 mg | SUBLINGUAL_TABLET | Freq: Once | SUBLINGUAL | Status: AC
  Administered 2023-03-13: 0.8 mg via SUBLINGUAL

## 2023-03-13 NOTE — Progress Notes (Signed)
Patient tolerated CT well. Drank water after. Vital signs stable encourage to drink water throughout day.Reasons explained and verbalized understanding. Ambulated steady gait.  

## 2023-04-07 ENCOUNTER — Ambulatory Visit: Payer: Medicare PPO | Attending: Cardiology

## 2023-04-07 DIAGNOSIS — I2510 Atherosclerotic heart disease of native coronary artery without angina pectoris: Secondary | ICD-10-CM | POA: Diagnosis not present

## 2023-04-07 LAB — ECHOCARDIOGRAM COMPLETE
AR max vel: 1.98 cm2
AV Area VTI: 1.97 cm2
AV Area mean vel: 1.88 cm2
AV Mean grad: 2 mmHg
AV Peak grad: 3.3 mmHg
Ao pk vel: 0.91 m/s
Area-P 1/2: 3.91 cm2
S' Lateral: 3.4 cm

## 2023-05-05 ENCOUNTER — Ambulatory Visit: Payer: Medicare PPO | Attending: Cardiology | Admitting: Cardiology

## 2023-05-05 ENCOUNTER — Encounter: Payer: Self-pay | Admitting: Cardiology

## 2023-05-05 VITALS — BP 140/62 | HR 73 | Ht 62.0 in | Wt 135.4 lb

## 2023-05-05 DIAGNOSIS — E7800 Pure hypercholesterolemia, unspecified: Secondary | ICD-10-CM | POA: Diagnosis not present

## 2023-05-05 DIAGNOSIS — I2510 Atherosclerotic heart disease of native coronary artery without angina pectoris: Secondary | ICD-10-CM | POA: Diagnosis not present

## 2023-05-05 NOTE — Progress Notes (Signed)
Cardiology Office Note:    Date:  05/05/2023   ID:  FE ESSENBURG, DOB 09-23-1956, MRN [id number]  PCP:  Charolotte Eke, FNP   [facility]  Cardiologist:  Debbe Odea, MD  Advanced Practice Provider:  No care team member to display Electrophysiologist:  None    Referring MD: Charolotte Eke, FNP   Chief Complaint  Patient presents with   Follow-up    Discuss echo and CTA results.  Patient denies new or acute cardiac problems/concerns today.     History of Present Illness:    Brittany Reese is a 67 y.o. female with a hx of CAD (mild RCA stenosis, minimal left main and LAD stenosis on coronary CT), hyperlipidemia, former smoker x20+ years who presents for follow-up.  She was previously seen due to coronary calcifications.  Pulmonary CT was obtained to evaluate obstructive CAD.  Echocardiogram also obtained.  Presents for testing results.  Denies chest pain or shortness of breath.  Compliant with medications as prescribed.  Prior notes.  Chest CT 11/2020 LAD, RCA calcifications Echocardiogram 12/2020 EF 55 to 60% Family history of MI in her mother around age 52s to 2s.  States having a little bit of anxiety but otherwise doing okay.   Past Medical History:  Diagnosis Date   Depression    Hyperlipidemia    Thyroid disease     History reviewed. No pertinent surgical history.  Current Medications: Current Meds  Medication Sig   aspirin EC 81 MG tablet Take 1 tablet (81 mg total) by mouth daily. Swallow whole.   atorvastatin (LIPITOR) 40 MG tablet Take 1 tablet (40 mg total) by mouth at bedtime.   clobetasol ointment (TEMOVATE) 0.05 % Apply 1 application topically at bedtime.   fluocinonide-emollient (LIDEX-E) 0.05 % cream Apply 1 application topically 2 (two) times daily.   FLUoxetine (PROZAC) 20 MG capsule Take 1 capsule (20 mg total) by mouth daily.   levothyroxine (SYNTHROID) 75 MCG tablet Take 1 tablet (75 mcg total) by mouth  daily before breakfast.   zolpidem (AMBIEN) 10 MG tablet TAKE 1/2 TO 1 (ONE-HALF TO ONE) TABLET BY MOUTH AT BEDTIME AS NEEDED FOR SLEEP     Allergies:   Patient has no known allergies.   Social History   Socioeconomic History   Marital status: Married    Spouse name: Not on file   Number of children: Not on file   Years of education: Not on file   Highest education level: Not on file  Occupational History   Not on file  Tobacco Use   Smoking status: Former    Packs/day: 1.00    Years: 41.00    Additional pack years: 0.00    Total pack years: 41.00    Types: Cigarettes    Quit date: 10/18/2014    Years since quitting: 8.5   Smokeless tobacco: Never  Vaping Use   Vaping Use: Never used  Substance and Sexual Activity   Alcohol use: Yes    Alcohol/week: 3.0 standard drinks of alcohol    Types: 3 Cans of beer per week    Comment: occasional   Drug use: No   Sexual activity: Never    Birth control/protection: None  Other Topics Concern   Not on file  Social History Narrative   Not on file   Social Determinants of Health   Financial Resource Strain: Not on file  Food Insecurity: Not on file  Transportation Needs: Not on file  Physical Activity: Not on file  Stress: Not on file  Social Connections: Not on file     Family History: The patient's family history includes COPD in her mother; Healthy in her brother and brother; Heart disease in her mother; Hypertension in her mother; Kidney disease in her father. There is no history of Breast cancer.  ROS:   Please see the history of present illness.     All other systems reviewed and are negative.  EKGs/Labs/Other Studies Reviewed:    The following studies were reviewed today:   EKG:  EKG not ordered today.   Recent Labs: No results found for requested labs within last 365 days.  Recent Lipid Panel    Component Value Date/Time   CHOL 149 05/08/2022 1024   CHOL 211 (H) 11/01/2020 1405   TRIG 79 05/08/2022  1024   HDL 51 05/08/2022 1024   HDL 54 11/01/2020 1405   CHOLHDL 2.9 05/08/2022 1024   VLDL 16 05/08/2022 1024   LDLCALC 82 05/08/2022 1024   LDLCALC 133 (H) 11/01/2020 1405   LDLCALC 119 (H) 10/23/2017 1115     Risk Assessment/Calculations:      Physical Exam:    VS:  BP (!) 140/62 (BP Location: Left Arm, Patient Position: Sitting, Cuff Size: Normal)   Pulse 73   Ht 5\' 2"  (1.575 m)   Wt 135 lb 6.4 oz (61.4 kg)   SpO2 96%   BMI 24.76 kg/m     Wt Readings from Last 3 Encounters:  05/05/23 135 lb 6.4 oz (61.4 kg)  03/03/23 138 lb (62.6 kg)  05/16/22 134 lb (60.8 kg)     GEN:  Well nourished, well developed in no acute distress HEENT: Normal NECK: No JVD; No carotid bruits CARDIAC: RRR, no murmurs, rubs, gallops RESPIRATORY:  Clear to auscultation without rales, wheezing or rhonchi  ABDOMEN: Soft, non-tender, non-distended MUSCULOSKELETAL:  No edema; No deformity  SKIN: Warm and dry NEUROLOGIC:  Alert and oriented x 3 PSYCHIATRIC:  Normal affect   ASSESSMENT:    1. Coronary artery disease involving native coronary artery of native heart without angina pectoris   2. Pure hypercholesterolemia     PLAN:    In order of problems listed above:  CAD, mild nonobstructive RCA disease 25%, Minimal LAD disease on coronary CT. echo with EF 50 to 55%.  Continue aspirin 81 mg, Lipitor 40 mg  Hyperlipidemia, cholesterol controlled, continue Lipitor 40 mg daily.  Follow-up in 12 months   Medication Adjustments/Labs and Tests Ordered: Current medicines are reviewed at length with the patient today.  Concerns regarding medicines are outlined above.  No orders of the defined types were placed in this encounter.  No orders of the defined types were placed in this encounter.   Patient Instructions  Medication Instructions:   Your physician recommends that you continue on your current medications as directed. Please refer to the Current Medication list given to you  today.  *If you need a refill on your cardiac medications before your next appointment, please call your pharmacy*   Lab Work:  None Ordered  If you have labs (blood work) drawn today and your tests are completely normal, you will receive your results only by: MyChart Message (if you have MyChart) OR A paper copy in the mail If you have any lab test that is abnormal or we need to change your treatment, we will call you to review the results.   Testing/Procedures:  None Ordered   Follow-Up: At [facility], you and your health needs are our priority.  As part of our continuing mission to provide you with exceptional heart care, we have created designated Provider Care Teams.  These Care Teams include your primary Cardiologist (physician) and Advanced Practice Providers (APPs -  Physician Assistants and Nurse Practitioners) who all work together to provide you with the care you need, when you need it.  We recommend signing up for the patient portal called "MyChart".  Sign up information is provided on this After Visit Summary.  MyChart is used to connect with patients for Virtual Visits (Telemedicine).  Patients are able to view lab/test results, encounter notes, upcoming appointments, etc.  Non-urgent messages can be sent to your provider as well.   To learn more about what you can do with MyChart, go to [email or web address].    Your next appointment:   12 month(s)  Provider:   You may see Debbe Odea, MD or one of the following Advanced Practice Providers on your designated Care Team:   Nicolasa Ducking, NP Eula Listen, PA-C Cadence Fransico Michael, PA-C Charlsie Quest, NP    Signed, Debbe Odea, MD  05/05/2023 2:41 PM    [facility]

## 2023-05-05 NOTE — Patient Instructions (Signed)
Medication Instructions:   Your physician recommends that you continue on your current medications as directed. Please refer to the Current Medication list given to you today.  *If you need a refill on your cardiac medications before your next appointment, please call your pharmacy*   Lab Work:  None Ordered  If you have labs (blood work) drawn today and your tests are completely normal, you will receive your results only by: MyChart Message (if you have MyChart) OR A paper copy in the mail If you have any lab test that is abnormal or we need to change your treatment, we will call you to review the results.   Testing/Procedures:  None Ordered    Follow-Up: At [facility], you and your health needs are our priority.  As part of our continuing mission to provide you with exceptional heart care, we have created designated Provider Care Teams.  These Care Teams include your primary Cardiologist (physician) and Advanced Practice Providers (APPs -  Physician Assistants and Nurse Practitioners) who all work together to provide you with the care you need, when you need it.  We recommend signing up for the patient portal called "MyChart".  Sign up information is provided on this After Visit Summary.  MyChart is used to connect with patients for Virtual Visits (Telemedicine).  Patients are able to view lab/test results, encounter notes, upcoming appointments, etc.  Non-urgent messages can be sent to your provider as well.   To learn more about what you can do with MyChart, go to [email or web address].    Your next appointment:   12 month(s)  Provider:   You may see Brian Agbor-Etang, MD or one of the following Advanced Practice Providers on your designated Care Team:   Christopher Berge, NP Ryan Dunn, PA-C Cadence Furth, PA-C Sheri Hammock, NP  

## 2023-11-11 ENCOUNTER — Encounter: Payer: Self-pay | Admitting: Cardiology

## 2023-11-13 ENCOUNTER — Telehealth: Payer: Self-pay

## 2023-11-13 DIAGNOSIS — E785 Hyperlipidemia, unspecified: Secondary | ICD-10-CM

## 2023-11-13 MED ORDER — ATORVASTATIN CALCIUM 80 MG PO TABS: 80.0000 mg | ORAL_TABLET | Freq: Every day | ORAL | 3 refills | Status: DC

## 2023-11-13 NOTE — Telephone Encounter (Signed)
Medication list updated and increased dose sent to pharmacy.

## 2024-05-10 ENCOUNTER — Ambulatory Visit

## 2024-05-10 DIAGNOSIS — Z860100 Personal history of colon polyps, unspecified: Secondary | ICD-10-CM | POA: Diagnosis not present

## 2024-05-10 DIAGNOSIS — K5730 Diverticulosis of large intestine without perforation or abscess without bleeding: Secondary | ICD-10-CM | POA: Diagnosis not present

## 2024-05-10 DIAGNOSIS — Z09 Encounter for follow-up examination after completed treatment for conditions other than malignant neoplasm: Secondary | ICD-10-CM | POA: Diagnosis not present

## 2024-05-10 DIAGNOSIS — K640 First degree hemorrhoids: Secondary | ICD-10-CM

## 2024-05-10 DIAGNOSIS — K635 Polyp of colon: Secondary | ICD-10-CM | POA: Diagnosis not present

## 2024-09-02 ENCOUNTER — Encounter: Payer: Self-pay | Admitting: Cardiology

## 2024-09-10 ENCOUNTER — Ambulatory Visit: Attending: Cardiology | Admitting: Cardiology

## 2024-09-10 ENCOUNTER — Encounter: Payer: Self-pay | Admitting: Cardiology

## 2024-09-10 VITALS — BP 130/57 | HR 67 | Ht 62.0 in | Wt 132.0 lb

## 2024-09-10 DIAGNOSIS — I2510 Atherosclerotic heart disease of native coronary artery without angina pectoris: Secondary | ICD-10-CM | POA: Diagnosis not present

## 2024-09-10 DIAGNOSIS — E7800 Pure hypercholesterolemia, unspecified: Secondary | ICD-10-CM

## 2024-09-10 NOTE — Progress Notes (Signed)
 Cardiology Office Note:    Date:  09/10/2024   ID:  Brittany Reese, DOB 01-11-1956, MRN [id number]  PCP:  Cammie Alanson HERO, FNP   [facility]  Cardiologist:  Redell Cave, MD  Advanced Practice Provider:  No care team member to display Electrophysiologist:  None    Referring MD: Cammie Alanson HERO, FNP   Chief Complaint  Patient presents with   Follow-up    12 month follow up pt has been doing well with no complaints of chest pain, chest pressure or SOB, medciation reviewed verbally with patient   History of Present Illness:    Brittany Reese is a 68 y.o. female with a hx of CAD (mild RCA stenosis, minimal left main and LAD stenosis on coronary CT), hyperlipidemia, former smoker x20+ years who presents for follow-up.  Doing okay, denies chest pain or shortness of breath.  Last cholesterol levels were elevated, Lipitor increased to 8 6 6  0 mg daily.  Plans to follow-up lipid panel with annual physical in about 2 months via via PCP.  Prior notes.  Chest CT 11/2020 LAD, RCA calcifications Echocardiogram 12/2020 EF 55 to 60% Family history of MI in her mother around age 8s to 60s.  States having a little bit of anxiety but otherwise doing okay.   Past Medical History:  Diagnosis Date   Depression    Hyperlipidemia    Thyroid disease     No past surgical history on file.  Current Medications: Current Meds  Medication Sig   aspirin  EC 81 MG tablet Take 1 tablet (81 mg total) by mouth daily. Swallow whole.   atorvastatin  (LIPITOR) 80 MG tablet Take 1 tablet (80 mg total) by mouth at bedtime.   clobetasol  ointment (TEMOVATE ) 0.05 % Apply 1 application topically at bedtime.   fluocinonide -emollient (LIDEX -E) 0.05 % cream Apply 1 application topically 2 (two) times daily.   FLUoxetine  (PROZAC ) 20 MG capsule Take 1 capsule (20 mg total) by mouth daily.   levothyroxine  (SYNTHROID ) 75 MCG tablet Take 1 tablet (75 mcg total) by mouth daily  before breakfast.   zolpidem  (AMBIEN ) 10 MG tablet TAKE 1/2 TO 1 (ONE-HALF TO ONE) TABLET BY MOUTH AT BEDTIME AS NEEDED FOR SLEEP     Allergies:   Patient has no known allergies.   Social History   Socioeconomic History   Marital status: Married    Spouse name: Not on file   Number of children: Not on file   Years of education: Not on file   Highest education level: Not on file  Occupational History   Not on file  Tobacco Use   Smoking status: Former    Current packs/day: 0.00    Average packs/day: 1 pack/day for 41.0 years (41.0 ttl pk-yrs)    Types: Cigarettes    Start date: 10/18/1973    Quit date: 10/18/2014    Years since quitting: 9.9   Smokeless tobacco: Never  Vaping Use   Vaping status: Never Used  Substance and Sexual Activity   Alcohol use: Yes    Alcohol/week: 3.0 standard drinks of alcohol    Types: 3 Cans of beer per week    Comment: occasional   Drug use: No   Sexual activity: Never    Birth control/protection: None  Other Topics Concern   Not on file  Social History Narrative   Not on file   Social Drivers of Health   Financial Resource Strain: Not on file  Food Insecurity: No  Food Insecurity (08/25/2024)   Received from [facility]   Hunger Vital Sign    Within the past 12 months, you worried that your food would run out before you got the money to buy more.: Never true    Within the past 12 months, the food you bought just didn't last and you didn't have money to get more.: Never true  Transportation Needs: No Transportation Needs (08/25/2024)   Received from [facility] - Transportation    Lack of Transportation (Medical): No    Lack of Transportation (Non-Medical): No  Physical Activity: Not on file  Stress: Not on file  Social Connections: Not on file     Family History: The patient's family history includes COPD in her mother; Healthy in her brother and brother; Heart disease in her mother; Hypertension in her mother;  Kidney disease in her father. There is no history of Breast cancer.  ROS:   Please see the history of present illness.     All other systems reviewed and are negative.  EKGs/Labs/Other Studies Reviewed:    The following studies were reviewed today:   EKG Interpretation Date/Time:  Friday September 10 2024 13:44:05 EDT Ventricular Rate:  67 PR Interval:  134 QRS Duration:  74 QT Interval:  394 QTC Calculation: 416 R Axis:   46  Text Interpretation: Normal sinus rhythm Nonspecific ST and T wave abnormality Confirmed by Darliss Rogue ([id number]) on 09/10/2024 1:51:11 PM    Recent Labs: No results found for requested labs within last 365 days.  Recent Lipid Panel    Component Value Date/Time   CHOL 149 05/08/2022 1024   CHOL 211 (H) 11/01/2020 1405   TRIG 79 05/08/2022 1024   HDL 51 05/08/2022 1024   HDL 54 11/01/2020 1405   CHOLHDL 2.9 05/08/2022 1024   VLDL 16 05/08/2022 1024   LDLCALC 82 05/08/2022 1024   LDLCALC 133 (H) 11/01/2020 1405   LDLCALC 119 (H) 10/23/2017 1115     Risk Assessment/Calculations:      Physical Exam:    VS:  BP (!) 130/57 (BP Location: Left Arm, Patient Position: Sitting, Cuff Size: Normal)   Pulse 67   Ht 5' 2 (1.575 m)   Wt 132 lb (59.9 kg)   SpO2 99%   BMI 24.14 kg/m     Wt Readings from Last 3 Encounters:  09/10/24 132 lb (59.9 kg)  05/05/23 135 lb 6.4 oz (61.4 kg)  03/03/23 138 lb (62.6 kg)     GEN:  Well nourished, well developed in no acute distress HEENT: Normal NECK: No JVD; No carotid bruits CARDIAC: RRR, no murmurs, rubs, gallops RESPIRATORY:  Clear to auscultation without rales, wheezing or rhonchi  ABDOMEN: Soft, non-tender, non-distended MUSCULOSKELETAL:  No edema; No deformity  SKIN: Warm and dry NEUROLOGIC:  Alert and oriented x 3 PSYCHIATRIC:  Normal affect   ASSESSMENT:    1. Coronary artery disease involving native coronary artery of native heart without angina pectoris   2. Pure hypercholesterolemia     PLAN:    In order of problems listed above:  CAD, mild nonobstructive RCA disease 25%, Minimal LAD disease on coronary CT. denies chest pain.  Echo 5/24 EF 50 to 55%.  Continue aspirin  81 mg, Lipitor 80 mg  Hyperlipidemia, cholesterol controlled, continue Lipitor 80 mg daily.  Follow-up lipid panel with PCP in about 2 months.  Follow-up in 12 months   Medication Adjustments/Labs and Tests Ordered: Current medicines are reviewed at  length with the patient today.  Concerns regarding medicines are outlined above.  Orders Placed This Encounter  Procedures   EKG 12-Lead   No orders of the defined types were placed in this encounter.   Patient Instructions  Medication Instructions:  Your physician recommends that you continue on your current medications as directed. Please refer to the Current Medication list given to you today.   *If you need a refill on your cardiac medications before your next appointment, please call your pharmacy*  Lab Work: No labs ordered today  If you have labs (blood work) drawn today and your tests are completely normal, you will receive your results only by: MyChart Message (if you have MyChart) OR A paper copy in the mail If you have any lab test that is abnormal or we need to change your treatment, we will call you to review the results.  Testing/Procedures: No test ordered today   Follow-Up: At [facility], you and your health needs are our priority.  As part of our continuing mission to provide you with exceptional heart care, our providers are all part of one team.  This team includes your primary Cardiologist (physician) and Advanced Practice Providers or APPs (Physician Assistants and Nurse Practitioners) who all work together to provide you with the care you need, when you need it.  Your next appointment:   1 year(s)  Provider:   You may see Redell Cave, MD or one of the following Advanced Practice Providers on your designated  Care Team:   Lonni Meager, NP Lesley Maffucci, PA-C Bernardino Bring, PA-C Cadence Pomona, PA-C Tylene Lunch, NP Barnie Hila, NP    We recommend signing up for the patient portal called MyChart.  Sign up information is provided on this After Visit Summary.  MyChart is used to connect with patients for Virtual Visits (Telemedicine).  Patients are able to view lab/test results, encounter notes, upcoming appointments, etc.  Non-urgent messages can be sent to your provider as well.   To learn more about what you can do with MyChart, go to [email or web address].              Signed, Redell Cave, MD  09/10/2024 2:13 PM    [facility]

## 2024-09-10 NOTE — Patient Instructions (Signed)

## 2024-11-10 ENCOUNTER — Encounter: Payer: Self-pay | Admitting: Cardiology

## 2024-12-22 ENCOUNTER — Other Ambulatory Visit: Payer: Self-pay | Admitting: Cardiology

## 2024-12-22 DIAGNOSIS — E785 Hyperlipidemia, unspecified: Secondary | ICD-10-CM
